# Patient Record
Sex: Female | Born: 1945 | Race: Black or African American | Hispanic: No | Marital: Single | Smoking: Current some day smoker
Health system: Southern US, Community
[De-identification: ages and names within clinical notes are randomized; demographics above are authoritative.]

## PROBLEM LIST (undated history)

## (undated) DIAGNOSIS — I10 Essential (primary) hypertension: Secondary | ICD-10-CM

## (undated) DIAGNOSIS — E7800 Pure hypercholesterolemia, unspecified: Secondary | ICD-10-CM

## (undated) DIAGNOSIS — T7840XA Allergy, unspecified, initial encounter: Secondary | ICD-10-CM

## (undated) DIAGNOSIS — Z9289 Personal history of other medical treatment: Secondary | ICD-10-CM

## (undated) DIAGNOSIS — M1990 Unspecified osteoarthritis, unspecified site: Secondary | ICD-10-CM

## (undated) HISTORY — PX: COLONOSCOPY: SHX174

## (undated) HISTORY — PX: MOUTH SURGERY: SHX715

## (undated) HISTORY — PX: NECK SURGERY: SHX720

## (undated) HISTORY — DX: Allergy, unspecified, initial encounter: T78.40XA

## (undated) HISTORY — DX: Unspecified osteoarthritis, unspecified site: M19.90

---

## 2006-08-30 DIAGNOSIS — Z9289 Personal history of other medical treatment: Secondary | ICD-10-CM

## 2006-08-30 HISTORY — DX: Personal history of other medical treatment: Z92.89

## 2007-02-03 ENCOUNTER — Inpatient Hospital Stay (HOSPITAL_COMMUNITY): Admission: EM | Admit: 2007-02-03 | Discharge: 2007-02-04 | Payer: Self-pay | Admitting: Emergency Medicine

## 2007-02-03 ENCOUNTER — Ambulatory Visit: Payer: Self-pay | Admitting: Cardiology

## 2007-02-03 ENCOUNTER — Ambulatory Visit: Payer: Self-pay | Admitting: Cardiovascular Disease

## 2007-02-03 ENCOUNTER — Encounter: Payer: Self-pay | Admitting: Cardiovascular Disease

## 2007-02-19 ENCOUNTER — Inpatient Hospital Stay (HOSPITAL_COMMUNITY): Admission: EM | Admit: 2007-02-19 | Discharge: 2007-02-21 | Payer: Self-pay | Admitting: Emergency Medicine

## 2007-07-11 ENCOUNTER — Encounter: Admission: RE | Admit: 2007-07-11 | Discharge: 2007-07-11 | Payer: Self-pay | Admitting: Internal Medicine

## 2007-10-12 ENCOUNTER — Ambulatory Visit: Payer: Self-pay | Admitting: Thoracic Surgery

## 2007-10-27 ENCOUNTER — Ambulatory Visit: Payer: Self-pay | Admitting: Thoracic Surgery

## 2007-10-27 ENCOUNTER — Encounter: Payer: Self-pay | Admitting: Thoracic Surgery

## 2007-10-27 ENCOUNTER — Inpatient Hospital Stay (HOSPITAL_COMMUNITY): Admission: RE | Admit: 2007-10-27 | Discharge: 2007-10-29 | Payer: Self-pay | Admitting: Thoracic Surgery

## 2007-11-01 ENCOUNTER — Ambulatory Visit: Payer: Self-pay | Admitting: Thoracic Surgery

## 2007-11-01 ENCOUNTER — Encounter: Admission: RE | Admit: 2007-11-01 | Discharge: 2007-11-01 | Payer: Self-pay | Admitting: Thoracic Surgery

## 2009-01-18 ENCOUNTER — Emergency Department (HOSPITAL_COMMUNITY): Admission: EM | Admit: 2009-01-18 | Discharge: 2009-01-18 | Payer: Self-pay | Admitting: Family Medicine

## 2009-04-07 ENCOUNTER — Encounter: Admission: RE | Admit: 2009-04-07 | Discharge: 2009-04-07 | Payer: Self-pay | Admitting: Internal Medicine

## 2009-04-15 ENCOUNTER — Encounter: Admission: RE | Admit: 2009-04-15 | Discharge: 2009-04-15 | Payer: Self-pay | Admitting: Internal Medicine

## 2009-07-31 ENCOUNTER — Encounter: Payer: Self-pay | Admitting: Internal Medicine

## 2009-08-07 ENCOUNTER — Encounter (INDEPENDENT_AMBULATORY_CARE_PROVIDER_SITE_OTHER): Payer: Self-pay | Admitting: *Deleted

## 2009-09-08 ENCOUNTER — Ambulatory Visit: Payer: Self-pay | Admitting: Internal Medicine

## 2009-09-08 ENCOUNTER — Encounter (INDEPENDENT_AMBULATORY_CARE_PROVIDER_SITE_OTHER): Payer: Self-pay | Admitting: *Deleted

## 2009-09-19 ENCOUNTER — Ambulatory Visit: Payer: Self-pay | Admitting: Internal Medicine

## 2009-09-24 ENCOUNTER — Encounter: Payer: Self-pay | Admitting: Internal Medicine

## 2010-09-20 ENCOUNTER — Encounter: Payer: Self-pay | Admitting: Internal Medicine

## 2010-09-21 ENCOUNTER — Encounter: Payer: Self-pay | Admitting: Internal Medicine

## 2010-09-29 NOTE — Procedures (Signed)
Summary: Colonoscopy  Patient: [name] Note: All result statuses are Final unless otherwise noted.  Tests: (1) Colonoscopy (COL)   COL Colonoscopy           DONE     [facility]     [address]     [place], [place]  [address]           COLONOSCOPY PROCEDURE REPORT           PATIENT:  Elizabeth, [name]  MR#:  [id number]     BIRTHDATE:  1946/01/04, 63 yrs. old  GENDER:  female           ENDOSCOPIST:  Iva Boop, MD, [facility]     Referred by:  Kari Baars, M.D.           PROCEDURE DATE:  09/19/2009     PROCEDURE:  Colonoscopy with snare polypectomy     ASA CLASS:  Class II     INDICATIONS:  Routine Risk Screening           MEDICATIONS:   Fentanyl 37.5 mcg IV, Versed 5 mg IV           DESCRIPTION OF PROCEDURE:   After the risks benefits and     alternatives of the procedure were thoroughly explained, informed     consent was obtained.  Digital rectal exam was performed and     revealed no abnormalities.   The LB CF-H180AL [id number] endoscope     was introduced through the anus and advanced to the cecum, which     was identified by both the appendix and ileocecal valve, without     limitations.  The quality of the prep was excellent, using     MoviPrep.  The instrument was then slowly withdrawn as the colon     was fully examined.     Insertion: 4:00 minutes Withdrawal: 12:00 minutes     <<PROCEDUREIMAGES>>           FINDINGS:  Two polyps were found in the rectum and sigmoid colon.     They were 5 mm in size. Polyps were snared without cautery.     Retrieval was successful. Mild diverticulosis was found in the     sigmoid colon.  This was otherwise a normal examination of the     colon.   Retroflexed views in the rectum revealed no     abnormalities.    The scope was then withdrawn from the patient     and the procedure completed.           COMPLICATIONS:  None           ENDOSCOPIC IMPRESSION:     1)  Two 5mm polyps in the rectum and sigmoid colon, removed   2) Mild diverticulosis in the sigmoid colon     3) Otherwise normal examination, excellent prep           REPEAT EXAM:  In for Colonoscopy, pending biopsy results.           Iva Boop, MD, [facility]           CC:  Kari Baars, MD     The Patient           n.     eSIGNED:   Iva Boop at 09/19/2009 02:12 PM           Toni Arthurs, [id number]  Note: An exclamation mark (!) indicates a result  that was not dispersed into the flowsheet. Document Creation Date: 09/19/2009 2:12 PM _______________________________________________________________________  (1) Order result status: Final Collection or observation date-time: 09/19/2009 14:03 Requested date-time:  Receipt date-time:  Reported date-time:  Referring Physician:   Ordering Physician: Stan Head [phone]) Specimen Source:  Source: Launa Grill Order Number: [id number] Lab site:   Appended Document: Colonoscopy     Procedures Next Due Date:    Colonoscopy: 09/2014

## 2010-09-29 NOTE — Letter (Signed)
Summary: [facility] Instructions  [facility]  [address] [place], [place] [address]   Phone: [phone]  Fax: [phone]       Dijon ARENA    March 08, 1946    MRN: [id number]        Procedure Day /Date: Friday, 09/19/09     Arrival Time: 12:30pm      Procedure Time: 1:30pm     Location of Procedure:                    _X_  [facility] (4th Floor)   PREPARATION FOR COLONOSCOPY WITH MOVIPREP   Starting 5 days prior to your procedure 09/14/09 do not eat nuts, seeds, popcorn, corn, beans, peas,  salads, or any raw vegetables.  Do not take any fiber supplements (e.g. Metamucil, Citrucel, and Benefiber).  THE DAY BEFORE YOUR PROCEDURE         DATE: 09/18/09     DAY: Thursday  1.  Drink clear liquids the entire day-NO SOLID FOOD  2.  Do not drink anything colored red or purple.  Avoid juices with pulp.  No orange juice.  3.  Drink at least 64 oz. (8 glasses) of fluid/clear liquids during the day to prevent dehydration and help the prep work efficiently.  CLEAR LIQUIDS INCLUDE: Water Jello Ice Popsicles Tea (sugar ok, no milk/cream) Powdered fruit flavored drinks Coffee (sugar ok, no milk/cream) Gatorade Juice: apple, white grape, white cranberry  Lemonade Clear bullion, consomm, broth Carbonated beverages (any kind) Strained chicken noodle soup Hard Candy                             4.  In the morning, mix first dose of MoviPrep solution:    Empty 1 Pouch A and 1 Pouch B into the disposable container    Add lukewarm drinking water to the top line of the container. Mix to dissolve    Refrigerate (mixed solution should be used within 24 hrs)  5.  Begin drinking the prep at 5:00 p.m. The MoviPrep container is divided by 4 marks.   Every 15 minutes drink the solution down to the next mark (approximately 8 oz) until the full liter is complete.   6.  Follow completed prep with 16 oz of clear liquid of your choice (Nothing red or purple).   Continue to drink clear liquids until bedtime.  7.  Before going to bed, mix second dose of MoviPrep solution:    Empty 1 Pouch A and 1 Pouch B into the disposable container    Add lukewarm drinking water to the top line of the container. Mix to dissolve    Refrigerate  THE DAY OF YOUR PROCEDURE      DATE: 09/19/09   DAY: Friday  Beginning at 8:30a.m. (5 hours before procedure):         1. Every 15 minutes, drink the solution down to the next mark (approx 8 oz) until the full liter is complete.  2. Follow completed prep with 16 oz. of clear liquid of your choice.    3. You may drink clear liquids until 11:30am (2 HOURS BEFORE PROCEDURE).   MEDICATION INSTRUCTIONS  Unless otherwise instructed, you should take regular prescription medications with a small sip of water   as early as possible the morning of your procedure.  Additional medication instructions: None         OTHER INSTRUCTIONS  You will  need a responsible adult at least 65 years of age to accompany you and drive you home.   This person must remain in the waiting room during your procedure.  Wear loose fitting clothing that is easily removed.  Leave jewelry and other valuables at home.  However, you may wish to bring a book to read or  an iPod/MP3 player to listen to music as you wait for your procedure to start.  Remove all body piercing jewelry and leave at home.  Total time from sign-in until discharge is approximately 2-3 hours.  You should go home directly after your procedure and rest.  You can resume normal activities the  day after your procedure.  The day of your procedure you should not:   Drive   Make legal decisions   Operate machinery   Drink alcohol   Return to work  You will receive specific instructions about eating, activities and medications before you leave.    The above instructions have been reviewed and explained to me by   _______________________    I fully understand  and can verbalize these instructions _____________________________ Date _________

## 2010-09-29 NOTE — Assessment & Plan Note (Signed)
Summary: SCREEN FOR COLON   History of Present Illness Visit Type: new patient Primary GI MD: Stan Head MD [facility] Primary Provider: Buren Kos, MD  Requesting Provider: Buren Kos, MD  Chief Complaint: Colorectal Cancer Screening  History of Present Illness:   Here to meet me prior to screening colonoscopy.    GI Review of Systems      Denies abdominal pain, acid reflux, belching, bloating, chest pain, dysphagia with liquids, dysphagia with solids, heartburn, loss of appetite, nausea, vomiting, vomiting blood, weight loss, and  weight gain.        Denies anal fissure, black tarry stools, change in bowel habit, constipation, diarrhea, diverticulosis, fecal incontinence, heme positive stool, hemorrhoids, irritable bowel syndrome, jaundice, light color stool, liver problems, rectal bleeding, and  rectal pain.    Current Medications (verified): 1)  Lisinopril 20 Mg Tabs (Lisinopril) .... Take 1 Tablet By Mouth Once A Day 2)  Verapamil Hcl Cr 180 Mg Cr-Tabs (Verapamil Hcl) .... Take 1 Tablet By Mouth Once A Day 3)  Zocor 40 Mg Tabs (Simvastatin) .... Take 1 Tab By Mouth At Bedtime 4)  Claritin 10 Mg Tabs (Loratadine) .... Take 1 Tablet By Mouth Once A Day As Needed 5)  Flonase 50 Mcg/act Susp (Fluticasone Propionate) .... As Needed 6)  Naproxen 500 Mg Tabs (Naproxen) .... As Needed 7)  Chantix Continuing Month Pak 1 Mg Tabs (Varenicline Tartrate) .... Take 1 Tablet By Mouth Once A Day As Directed  Allergies (verified): No Known Drug Allergies  Past History:  Past Medical History: Hyperlipidemia Hypertension Diabetes---Diet Control   Past Surgical History: c-section Resection of left superior mediastinal mass Tubal Ligation  Family History: Family History of Breast Cancer: Sister Family History of Prostate Cancer: Brother Family History of Stomach Cancer: Sister Family History of Heart Disease: Sister-MI No FH of Colon Cancer:  Social History: Reviewed history from  09/05/2009 and no changes required. Single, 5 boys Retired-[employer] Patient currently smokes.  Illicit Drug Use - no Patient gets regular exercise. Alcohol Use - yes-occ  Vital Signs:  Patient profile:   65 year old female Height:      61.5 inches Weight:      170 pounds BMI:     31.72 BSA:     1.78 Pulse rate:   64 / minute Pulse rhythm:   regular BP sitting:   128 / 72  (left arm) Cuff size:   regular  Vitals Entered By: Ok Anis CMA (September 08, 2009 10:50 AM)  Physical Exam  General:  obese.  NAD Lungs:  Clear throughout to auscultation. Heart:  Regular rate and rhythm; no murmurs, rubs,  or bruits. Abdomen:  obese, soft and nontender without masses Rectal:  deferred until time of colonoscopy.     Impression & Recommendations:  Problem # 1:  SCREENING COLORECTAL-CANCER (ICD-V76.51) Assessment New  Risks, benefits,and indications of endoscopic procedure(s) were reviewed with the patient and all questions answered. Routine risk.  Orders: Colonoscopy (Colon)  Patient Instructions: 1)  We will see you at your procedure on 09/19/09. 2)  Please pick up your medications at your pharmacy.  3)  [facility] Patient Information Guide given to patient.  4)  Colonoscopy and Flexible Sigmoidoscopy brochure given.  5)  The medication list was reviewed and reconciled.  All changed / newly prescribed medications were explained.  A complete medication list was provided to the patient / caregiver. Prescriptions: MOVIPREP 100 GM  SOLR (PEG-KCL-NACL-NASULF-NA ASC-C) As per prep instructions.  #1  x 0   Entered by:   Francee Piccolo CMA (AAMA)   Authorized by:   Iva Boop MD, [facility]   Signed by:   Francee Piccolo CMA (AAMA) on 09/08/2009   Method used:   Electronically to        [facility]  [address] [phone]* (retail)       [address]       [place], [place]  [address]       Ph: [phone] or [phone]       Fax:  [phone]   RxID:   [id number]

## 2010-09-29 NOTE — Letter (Signed)
Summary: Patient Notice- Polyp Results  [facility]  [address] [place], [place] [address]   Phone: [phone]  Fax: [phone]        September 24, 2009 MRN: [id number]    [name] [address] [place], [place]  [address]    Dear Ms. 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 This means that they were pre-cancerous or that  they had the potential to change into cancer over time.  I recommend that you have a repeat colonoscopy in 5 years to determine if you have developed any new polyps over time. If you develop any new rectal bleeding, abdominal pain or significant bowel habit changes, please contact us before then.  In addition to repeating colonoscopy, changing health habits may reduce your risk of having more colon polyps and possibly, colon cancer. You may lower your risk of future polyps and colon cancer by adopting healthy habits such as not smoking or using tobacco (if you do), being physically active, losing weight (if overweight), and eating a diet which includes fruits and vegetables and limits red meat.   Please call us if you are having persistent problems or have questions about your condition that have not been fully answered at this time.  Sincerely,  Iva Boop MD, [facility]  This letter has been electronically signed by your physician.  Appended Document: Patient Notice- Polyp Results Letter mailed 1.27.11

## 2011-01-12 NOTE — Letter (Signed)
November 01, 2007   Kari Baars, M.D.  [address]  [place], [place] [address]   Re:  DARRELLE, BARRELL                 DOB:  1946-04-27   Dear Gala Romney:   I saw Ms. 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 We did this through a left supraclavicular approach,  and it turned out to be a benign cyst. This was between blood vessels  and had a lot of hemosiderin pigment inside the cyst. We had to resect  it off the subclavian artery and left carotid artery and left internal  jugular vein as well as the left subclavian vein, but we were able to  get it out without too much difficulty.   Chest x-ray today shows no evidence of pneumothorax and just  postoperative changes. Her blood pressure was 163/95, pulse 88,  respirations 18, and sats were 95%.   Her wounds are healing well. I will see her back again in three weeks  for a followup.   Ines Bloomer, M.D.  Electronically Signed   DPB/MEDQ  D:  11/01/2007  T:  11/01/2007  Job:  [id number]

## 2011-01-12 NOTE — Discharge Summary (Signed)
[name], [name]                 ACCOUNT NO.:  [id number]   MEDICAL RECORD NO.:  [id number]          PATIENT TYPE:  EMS   LOCATION:  MAJO                         FACILITY:  [facility]   PHYSICIAN:  Bettey Mare. Lawrence, NPDATE OF BIRTH:  16-Dec-1945   DATE OF ADMISSION:  02/03/2007  DATE OF DISCHARGE:                               DISCHARGE SUMMARY   Audio too short to transcribe (less than 5 seconds)      Bettey Mare. Lyman Bishop, NP     KML/MEDQ  D:  02/03/2007  T:  02/03/2007  Job:  [id number]

## 2011-01-12 NOTE — Consult Note (Signed)
[name], [name]                 ACCOUNT NO.:  [id number]   MEDICAL RECORD NO.:  [id number]          PATIENT TYPE:  EMS   LOCATION:  MAJO                         FACILITY:  [facility]   PHYSICIAN:  Peter C. Eden Emms, MD, FACCDATE OF BIRTH:  [birth year]   DATE OF CONSULTATION:  02/03/2007  DATE OF DISCHARGE:                                 CONSULTATION   CARDIAC CONSULTATION:   PRIMARY CARE PHYSICIAN:  Dr. Clelia Croft.   PRIMARY CARDIOLOGIST:  Will be new, Dr. Charlton Haws.   REASON FOR CONSULTATION:  Chest pain.   HISTORY OF PRESENT ILLNESS:  This 65 year old African-American female  with no past medical history of coronary artery disease with complaints  of chest pain which began around 9:30 p.m. last night on February 02, 2007,  while getting ready for bed.  The pain comes and goes, describes as  sharp, nonradiating, with associated dyspnea and diaphoresis.  The  patient states that it lasted seconds, and even taking a deep breath  caused pain to return.  The patient states that happened on and off all  night.  She was nauseated and vomited 3 times.  When she awoke this  morning, she continues to have some chest discomfort and was seen by a  primary care physician, Dr. Clelia Croft.  Nitroglycerin sublingual was given  without relief, and aspirin was given without relief.  She was sent to  the emergency room.  The patient has had no appetite x3 days and has had  some nausea over the last 3 days.   REVIEW OF SYSTEMS:  Positive for chest pain, diaphoresis, shortness of  breath, nausea, and vomiting.  All other systems are reviewed and are  found to be negative.   PAST MEDICAL HISTORY:  1. Hypertension.  2. Hypercholesterolemia.  3. Chronic sinus allergies.   PAST SURGICAL HISTORY:  C-section.   SOCIAL:  The patient lives in [place] alone.  She is retired.  She is  single with 4 boys.  She is a 1- or 2- cigarette-a-day smoker for the  last 20 years.  Negative for EtOH and drug use.   FAMILY  HISTORY:  Mother died at age 35 of old age.  Father died at age  90 of old age.  She has 1 sister with history of breast cancer and 1  brother with prostate difficulties.   CURRENT MEDICATIONS:  1. Verapamil 180 once a day.  2. Claritin 10 mg daily.  3. Flonase once a day.  4. Naprosyn 500 mg b.i.d.  5. Zocor 40 mg q.h.s.   ALLERGIES:  No known drug allergies.   CURRENT LABORATORY:  Sodium 139, potassium of 3.7, chloride 110, CO2 of  24, BUN 10, creatinine 0.8, glucose 78, hemoglobin 15.0, hematocrit 44.  Point of care cardiac enzymes:  Troponin less than 0.05, CK-MB 1.16,  myoglobin 118.  EKG revealing normal sinus rhythm, ventricular rate of  80 beats per minute with nonspecific T wave abnormalities.  Chest x-ray  revealing no active disease.   PHYSICAL EXAMINATION:  VITAL SIGNS:  Blood pressure 152/77, pulse 84,  respirations 24, temperature  97.8, O2 sat 100% on 3 liters.  HEENT:  Head is normocephalic and atraumatic.  Eyes PERRLA.  Mucous  membranes in mouth pink and moist.  Tongue is midline.  NECK:  Supple.  There is no JVD, no carotid bruits are appreciated.  CARDIOVASCULAR:  Regular rate and rhythm with soft 1/6 systolic murmur  auscultated without rubs or gallops.  Pulses are 2+ and equal.  LUNGS:  Clear to auscultation without wheezes, rales, or rhonchi.  ABDOMEN:  Soft.  There was tenderness noted in the epigastric area with  positive Murphy's sign and exquisite tenderness to the epigastric area  with palpation with no rebound or guarding.  There was no hepatomegaly  noted.  EXTREMITIES:  Without clubbing, cyanosis, or edema.  NEURO:  Cranial nerves II-XII are grossly intact.   IMPRESSION:  1. Atypical chest pain.  2. Abdominal pain with positive Murphy's sign, rule out cholelithiasis      and cholecystitis.  3. Hypertension.  4. Hypercholesterolemia.   PLAN:  This is a 65 year old African-American female with no prior  cardiac history with nausea and vomiting x3  days and atypical chest pain  with positive Murphy's sign, negative point of care markers and EKG.  The patient was seen and examined by myself and Dr. Eden Emms in the  emergency room.  We will cycle cardiac enzymes, get an abdominal  ultrasound to rule out cholelithiasis or cholecystitis.  Echocardiogram  for left ventricular function secondary to hypertension.  GI cocktail  p.r.n.  The patient will be admitted through internal medicine, primary  care, and we will follow along at a distance, making further  recommendations for any cardiac issues or abnormalities noted.      Bettey Mare. Lyman Bishop, NP      Noralyn Pick. Eden Emms, MD, [facility]  Electronically Signed    KML/MEDQ  D:  02/03/2007  T:  02/03/2007  Job:  [id number]   cc:   Clelia Croft, MD

## 2011-01-12 NOTE — H&P (Signed)
NAMESHOLONDA, JOBST                 ACCOUNT NO.:  [id number]   MEDICAL RECORD NO.:  [id number]          PATIENT TYPE:  INP   LOCATION:  3704                         FACILITY:  [facility]   PHYSICIAN:  Tera Mater. Evlyn Kanner, M.D. DATE OF BIRTH:  [birth year]   DATE OF ADMISSION:  02/19/2007  DATE OF DISCHARGE:                              HISTORY & PHYSICAL   HISTORY OF PRESENT ILLNESS:  Ms. [name] is a 65 year old black female  who was actually recently hospitalized who presents with tongue  swelling.  This started sometime in the last 10-12 hours.  She noted  that her throat was itching anteriorly and her tongue felt thick.  She  presented for evaluation and was diagnosed with angioedema.  She  recently had been started on lisinopril on February 04, 2007 as a new  prescription.  The patient has still been able to swallow and handle her  secretions.  Her tongue, however, is significantly swollen and has the  potential for air way compromise.  At the present time most of this is  within the mucous membranes of the mouth.  Her face itself is minimally  swollen over the chin and cheeks.  Her throat itself is itchy but not  swollen in a circumferential way.  Her breathing has not been  compromised.  She feels like she is getting her air perfectly well.  She  has not exhibited any evidence of stridor.  She denies any pain or other  problems.  The patient has already received Solu-Medrol, Benadryl and  famotidine and is actually doing better.  Due to the significance of  this swelling, however, the patient needs to be admitted at least  overnight to let this process resolve.   PAST MEDICAL HISTORY:  Includes recent hospitalization on February 03, 2007  for atypical chest pain.  It was felt to be musculoskeletal.  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  She has a history of elevated CK, elevated bilirubin,  hypertension, hyperlipidemia, impaired fasting  glucose, prior C-section,  prior neck laceration 10 years ago.   SOCIAL HISTORY:  Reveals she is single.  She lives alone.  She has 5  sons.  She is retired.  She is a smoker of 1 pack per week   FAMILY HISTORY:  Positive for alcoholism, hypertension in the father.  Mother had hypertension.  Sister had stomach cancer.  Sister had MI at  age 32.  Sister had breast cancer.  Brother had prostate cancer.   MEDICATIONS:  1. Aspirin 81 mg.  2. Naproxen p.r.n..  3. Flexeril p.r.n..  4. Lisinopril 20 mg.  5. Verapamil 180 mg.  6. Claritin p.r.n.  7. Flonase p.r.n.  8. Colace.  9. MiraLax p.r.n.  10.She had been on Zocor but this was stopped due to elevated CK.   REVIEW OF SYSTEMS:  As noted above.   PHYSICAL EXAMINATION:  VITAL SIGNS:  Blood pressure 144/84, pulse 72,  respirations 18, temperature 98.1, O2 sat is 98%.  GENERAL:  We have a  healthy appearing black female sitting up in no  distress.  She is having no difficulty breathing. No stridor noted.  HEENT:  Eyes clear anicteric.  Face is symmetric.  No periorbital edema  is present.  Extraocular movements intact.  Nasopharynx is clear without  swelling.  Oral mucous membranes are quite significantly abnormal.  There is probably 4 mm of sublingual swelling.  The tongue itself is  probably twice its normal size.  She can still protrude it out of her  mouth but it is limited in its motion.  There is still a clear airway.  No whistling or wheezing or stridor is noted.  NECK:  Reveals minimal scratching abrasions on the anterior part.  The  neck itself is not significantly swollen.  LUNGS:  Clear without wheezes, rales or rhonchi.  No accessory muscles  are in use  HEART:  Regular and I cannot appreciate any murmur.  ABDOMEN:  Soft, nontender without masses.  EXTREMITIES:  Reveal pulses to be intact.  NEURO:  The patient is awake, alert.  Her mentation is perfectly clear.  No resting tremors present.  SKIN:  Reveals mild  acanthosis.   LABORATORY DATA:  There really is not any at the moment.   SUMMARY:  In summary, we have a 65 year old black female recently  started on an ACE inhibitor now presenting with significant angioedema.  She will be admitted for observation and treatment with Solu-Medrol, IV  famotidine and IV Benadryl.  I do not think a consult with ENT or  surgery is necessary at the present time as her airway compromise does  not warrant this.  Certainly, if anything progressed, we could do that.  We are going to watch her on telemetry just based on the significant  nature of this problem.  Hopefully, she will be a 1-2 day stay depending  on how her airway does.           ______________________________  Tera Mater Evlyn Kanner, M.D.     SAS/MEDQ  D:  02/19/2007  T:  02/19/2007  Job:  [id number]

## 2011-01-12 NOTE — Letter (Signed)
October 12, 2007   Kari Baars, M.D.  [address]  [place], [place] [address]   Re:  FARHA, DANO                 DOB:  04/07/1946   Dear Gala Romney:   I saw Ms. [name] back in the office today.  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  This was thought  to be a possibility of a schwanoma, periganglion, perithyroid adenoma,  ectopic thyroid or a duplication cyst.  She had a repeat done on  July 11, 2007, which showed no major change.  She had her original  CT scan done for a pulmonary emboli, which was negative.  She has some  occasional pain there.  No fevers, chills or excessive sputum.  She has  had no weight loss.   PAST MEDICAL HISTORY:  Significant for hypertension for which she takes  Verapamil 180 mg a day.  She also takes cyclobenzaprine 10 mg a day  p.r.n.   ALLERGIES:  Aspirin causes hives.   FAMILY HISTORY:  Noncontributory.   SOCIAL HISTORY:  She is single.  She smokes two to three cigarettes per  day.  Does not drink alcohol on a regular basis.   REVIEW OF SYSTEMS:  She is 176 pounds.  She is 5 feet 4 inches.  Cardiac:  No angina or atrial fibrillation.  Pulmonary:  No hemoptysis, fever, chills, or excessive sputum.  GI:  No nausea, vomiting, constipation, diarrhea.  GU:  No kidney disease, dysuria or frequent urination.  Vascular:  No claudication, DVT, TIAs.  Neurological:  No dizziness, headaches, blackouts or seizures.  Musculoskeletal:  No joint pain or rash.  No psychiatric illness.  No change in her eyesight or hearing.  No problems with bleeding or clotting disorders.   PHYSICAL EXAMINATION:  She is a well developed African American female  in no acute distress.  Head, eyes, ears, nose and throat:  Unremarkable.  Neck:  Supple without thyromegaly.  There is no supraclavicular axial  adenopathy.  Chest:  Clear to auscultation and  percussion.  Heart:  Regular sinus rhythm.  No murmurs.  Abdomen:  Soft.  No  hepatosplenomegaly.  Pulses are 2+.  There is no clubbing or edema.  Neurological:  She is oriented x3.  Sensory and motor are intact.   Pulmonary function tests showed an FVC of 1.64 or 70% of predicted with  an FEV1 of 1.22 or 64% of predicted.   We made a distinct effort to try to palpate this mass at the base of the  left neck, but could not, although the patient did state that she  thought she could feel it sometimes.  I have discussed the surgery with  her and I recommend excision.  We can do this hopefully with a left  supraclavicular approach.  If not, we will have to extend this over to  the midline and do a partial or a full median sternotomy, depending on  how this mass is involved with the subclavian artery and vein, as well  as the carotid and internal jugular.  We plan to do the surgery on the  27 of February, and we will let you know our findings.   I appreciate the opportunity of seeing Ms. [name].   Ines Bloomer, M.D.  Electronically Signed  DPB/MEDQ  D:  10/12/2007  T:  10/13/2007  Job:  [id number]

## 2011-01-12 NOTE — Op Note (Signed)
NAMEXOIE, KREUSER                 ACCOUNT NO.:  [id number]   MEDICAL RECORD NO.:  [id number]          PATIENT TYPE:  INP   LOCATION:  2899                         FACILITY:  [facility]   PHYSICIAN:  Ines Bloomer, M.D. DATE OF BIRTH:  1945/12/21   DATE OF PROCEDURE:  10/27/2007  DATE OF DISCHARGE:                               OPERATIVE REPORT   PREOPERATIVE DIAGNOSIS:  Left superior mediastinal mass.   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   OPERATION PERFORMED:  Resection of left superior mediastinal mass via  left supraclavicular approach.   After general anesthesia, the chest was prepped and draped in the usual  sterile manner.  We were going to try to excise this mass which was at  the midclavicular head of the left clavicle posterior to the carotid  artery and jugular and superior to the subclavian vein and artery. After  general anesthesia, the head was turned to the right and the neck was  extended. We prepped so we could do a median sternotomy if we needed to  to excise the mass.  An incision was made over the left  sternocleidomastoid just above the clavicle and dissection was carried  down dividing the clavicular head of the sternocleidomastoid. We then  identified the jugular vein and carotid artery and vagus nerve and  dissected them up and looped them with a vascular tape to retract them  medially and laterally.  After dissecting superiorly, you could see a  cystic structure inferiorly. This structure on CT scan was 3.9 x 3 x 2.  Dissection was started superiorly dissecting this structure up off the  surrounding tissues and then we dissected down inferiorly dissecting  medially and laterally and inferiorly, the dividing all attachments with  sharp dissection. We avoided electrocautery because of the closeness to  the vagus nerve.  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  Several small bleeding vessels were clipped.  Then, the wound was closed  with interrupted 2-0 Vicryl in the muscle layer, 3-0 Vicryl in the  subcutaneous tissue, and Dermabond for the skin.  The patient was  returned to the recovery room in stable condition.      Ines Bloomer, M.D.  Electronically Signed     DPB/MEDQ  D:  10/27/2007  T:  10/28/2007  Job:  [id number]   cc:   Kari Baars, M.D.
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
## 2011-01-12 NOTE — Discharge Summary (Signed)
NAMEJERMEKA, [name]                 ACCOUNT NO.:  [id number]   MEDICAL RECORD NO.:  [id number]          PATIENT TYPE:  INP   LOCATION:  2010                         FACILITY:  [facility]   PHYSICIAN:  Ines Bloomer, M.D. DATE OF BIRTH:  Mar 24, 1946   DATE OF ADMISSION:  10/27/2007  DATE OF DISCHARGE:  10/29/2007                               DISCHARGE SUMMARY   HISTORY OF PRESENT ILLNESS:  The patient is a 65 year old black female  who has been followed for a finding of a left superior mediastinal mass  since 2006.  CT scans have been evaluated and most recently in November  of 2008 showed no significant change in a lesion that was felt to be a  possible Schwannoma, paraganglioma, parathyroid adenoma, ectopic  thyroid, or duplication cyst.  The patient describes occasional pain in  the left side of her chest.  She has no constitutional symptoms.  She  was referred to Dr. Edwyna Shell for resection.  She was admitted this  hospitalization for the procedure.   PAST MEDICAL HISTORY:  Hypertension.   ALLERGIES:  ASPIRIN causes urticaria.   MEDICATIONS:  1. Cardizem 360 mg daily.  2. Cyclobenzaprine 10 mg p.r.n. for pain.   FAMILY HISTORY:   SOCIAL HISTORY:   REVIEW OF SYSTEMS:   PHYSICAL EXAMINATION:  Please see the history and physical done at the  time of admission.   HOSPITAL COURSE:  The patient was admitted electively on October 27, 2007, and taken to the operating room at which time she underwent the  resection of a left superior mediastinal mass via a left supraclavicular  approach.  The procedure was performed by Dr. Edwyna Shell, tolerated well,  and she was taken to the postanesthesia care unit in stable condition.   Postoperative course, the patient has overall been quite stable.  She  has maintained stable hemodynamics.  Her incision is healing without  evidence of infection or bleeding.  She did have some mild sore throat  symptoms early on, but these have resolved.  She is  able to tolerate  eating.  Oxygen has been weaned and she maintains adequate saturations  on room air.  Her laboratory values are stable.  Most recent hemoglobin  and hematocrit dated October 28, 2007, are 12.3 and 38.1, respectively.  Electrolytes BUN and creatinine are within normal limits with the  exception of hypokalemia which has been supplemented.  Overall she is  felt to be stable for discharge on October 29, 2007.   CONDITION ON DISCHARGE:  Stable and improving.   PATHOLOGY:  Currently pending.   DISCHARGE DIAGNOSES:  1. Left superior mediastinal mass, status post resection.  2. Hypertension.   FOLLOWUP:  The patient will see Dr. Edwyna Shell next week in the office.  This appointment will be called to the patient.   DISCHARGE MEDICATIONS:  1. Claritin p.r.n.  2. Verapamil SA 360 mg daily.  3. Cyclobenzaprine 10 mg q.8 hours p.r.n.  4. Tylox one to two q.6 hours p.r.n.      Rowe Clack, P.A.-C.      Ines Bloomer, M.D.  Electronically Signed    WEG/MEDQ  D:  10/29/2007  T:  10/30/2007  Job:  [id number]   cc:   Kari Baars, M.D.
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
## 2011-01-12 NOTE — H&P (Signed)
NAMECOLUMBIA, [name]                 ACCOUNT NO.:  [id number]   MEDICAL RECORD NO.:  [id number]          PATIENT TYPE:  INP   LOCATION:  NA                           FACILITY:  [facility]   PHYSICIAN:  Ines Bloomer, M.D. DATE OF BIRTH:  1945-10-19   DATE OF ADMISSION:  DATE OF DISCHARGE:                              HISTORY & PHYSICAL   HOSPITAL COURSE:  Lung mass.   HISTORY OF PRESENT ILLNESS:  This 65 year old African-American female  had a CT scan done in June of 2004 and was found to have a 2.6 x 2.9 x  3.3 left superior mediastinal mass.  There was a possibility this was a  schwannoma, paraganglioma, parathyroid adenoma, ectopic thyroid or  duplication cyst.  She had a repeat on July 11, 2007, which showed  no major change.  The original CT scan was done for pulmonary embolus,  which was negative.  She has occasional pain in her left chest.  She has  had no fevers, chills, excessive sputum, weight loss or hemoptysis.   PAST MEDICAL HISTORY:  Significant for hypertension, and she takes her  medications, including verapamil 180 mg daily, and she takes  cyclobenzaprine 10 mg daily p.r.n.   ALLERGIES:  ASPIRIN CAUSES HIVES.   FAMILY HISTORY:  Noncontributory.   SOCIAL HISTORY:  She is single.  Smokes 2 to 3 cigarettes a day.  Does  not drink alcohol on a regular basis.   REVIEW OF SYSTEMS:  She is 176 pounds.  She is 5'4.  CARDIAC:  No  angina or atrial fibrillation.  PULMONARY:  No hemoptysis, fever,  chills, excessive sputum.  GASTROINTESTINAL:  No nausea, vomiting,  constipation, diarrhea or GERD.  GENITOURINARY:  No kidney disease,  dysuria, frequent urination.  VASCULAR:  No claudication, DVT, TIA.  NEUROLOGIC:  No dizziness, headaches, blackouts or seizures.  MUSCULOSKELETAL:  No joint pain.  PSYCHIATRIC:  No nervousness or  depression.  EYES/ENT:  No change in her eyesight or hearing.  HEMATOLOGIC:  No problems with bleeding, clotting disorders or anemia.   PHYSICAL  EXAMINATION:  GENERAL:  She is a well-developed Caucasian  female.  VITAL SIGNS:  Her pulmonary function tests showed an FVC of 1.64, or 70%  of predicted, an FEV1 of 1.72 or 64% of predicted.  Her blood pressure  was 143/85, pulse 78, respirations 18, sats 95%.  HEENT:  Head is atraumatic.  Eyes:  Pupils are equal and reactive to  light and accommodation.  Extraocular movements are normal.  Ears:  Tympanic membranes are intact.  Nose:  There is no septal deviation.  Throat:  Without lesion.  Uvula is in the midline.  NECK:  Supple without thyromegaly.  There is no supraclavicular or  axillary adenopathy.  CHEST:  Clear to auscultation and percussion.  HEART:  Regular sinus rhythm.  No murmur.  ABDOMEN:  Soft.  No hepatosplenomegaly.  BREASTS:  Without masses.  EXTREMITIES:  Pulses 2+.  There is no clubbing or edema.  NEUROLOGIC:  She is oriented x 3.  Sensory and motor are intact.  Cranial nerves intact.  IMPRESSION:  1. Left superior mediastinal mass.  2. Hypertension.   PLAN:  Removal of left superior mediastinal mass with supraclavicular  incision, possible median sternotomy.      Ines Bloomer, M.D.  Electronically Signed     DPB/MEDQ  D:  10/25/2007  T:  10/25/2007  Job:  [id number] [id number]
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
## 2011-05-21 LAB — ABO/RH: ABO/RH(D): O POS

## 2011-05-21 LAB — PROTIME-INR: Prothrombin Time: 12.1

## 2011-05-21 LAB — BASIC METABOLIC PANEL
BUN: 8
CO2: 27
Chloride: 100
Chloride: 102
Creatinine, Ser: 0.72
GFR calc Af Amer: >60
GFR calc Af Amer: >60
Glucose, Bld: 93
Potassium: 3.5
Sodium: 136

## 2011-05-21 LAB — CBC
HCT: 38.1
HCT: 38.8
Hemoglobin: 12.3
Hemoglobin: 12.6
MCHC: 32.4
MCHC: 32.5
Platelets: 219
Platelets: 232
RBC: 4.6
RBC: 4.7
RDW: 16.2 — ABNORMAL HIGH
WBC: 7.1

## 2011-05-21 LAB — WOUND CULTURE: Gram Stain: NONE SEEN

## 2011-05-21 LAB — TYPE AND SCREEN

## 2011-05-21 LAB — APTT: aPTT: 50 — ABNORMAL HIGH

## 2011-06-16 LAB — BASIC METABOLIC PANEL
BUN: 12
CO2: 27
Chloride: 102
Creatinine, Ser: 0.76
GFR calc Af Amer: >60
GFR calc non Af Amer: >60
Potassium: 3.6
Sodium: 138

## 2011-06-16 LAB — CBC
Platelets: 282
RDW: 16.7 — ABNORMAL HIGH

## 2011-06-17 LAB — CK TOTAL AND CKMB (NOT AT ARMC)
CK, MB: 2.4
Total CK: 324 — ABNORMAL HIGH

## 2011-06-17 LAB — COMPREHENSIVE METABOLIC PANEL
ALT: 34
Albumin: 4.3
Alkaline Phosphatase: 91
BUN: 7
CO2: 23
Calcium: 9.1
Calcium: 9.3
Chloride: 101
Creatinine, Ser: 0.77
GFR calc Af Amer: >60
GFR calc non Af Amer: >60
Glucose, Bld: 79
Potassium: 3.9
Sodium: 140
Total Bilirubin: 1.9 — ABNORMAL HIGH
Total Protein: 7.7

## 2011-06-17 LAB — CBC
HCT: 37.4
MCHC: 32.2
MCHC: 32.6
MCV: 80.3
Platelets: 212
RBC: 4.66
RDW: 16.6 — ABNORMAL HIGH
WBC: 4.8

## 2011-06-17 LAB — POCT CARDIAC MARKERS
Operator id: 279831
Troponin i, poc: <0.05

## 2011-06-17 LAB — CARDIAC PANEL(CRET KIN+CKTOT+MB+TROPI)
CK, MB: 2.3
Relative Index: 0.7
Relative Index: 0.7
Total CK: 336 — ABNORMAL HIGH
Total CK: 358 — ABNORMAL HIGH
Total CK: 371 — ABNORMAL HIGH
Troponin I: 0.01
Troponin I: 0.01

## 2011-06-17 LAB — I-STAT 8, (EC8 V) (CONVERTED LAB)
Acid-base deficit: 5 — ABNORMAL HIGH
Bicarbonate: 17.1 — ABNORMAL LOW
Glucose, Bld: 78
HCT: 44
Hemoglobin: 15
Operator id: 279831
Potassium: 3.7
Sodium: 139
TCO2: 18

## 2011-06-17 LAB — POCT I-STAT CREATININE
Creatinine, Ser: 0.8
Operator id: 279831

## 2011-06-17 LAB — LIPASE, BLOOD: Lipase: 31

## 2011-06-17 LAB — LIPID PANEL
Cholesterol: 189
HDL: 49
Total CHOL/HDL Ratio: 3.9
Triglycerides: 176 — ABNORMAL HIGH

## 2011-06-17 LAB — AMYLASE: Amylase: 83

## 2011-06-17 LAB — H. PYLORI ANTIBODY, IGG: H Pylori IgG: 0.8

## 2011-09-28 ENCOUNTER — Emergency Department (HOSPITAL_COMMUNITY)
Admission: EM | Admit: 2011-09-28 | Discharge: 2011-09-28 | Disposition: A | Discharge status: Home / Self Care | Payer: No Typology Code available for payment source | Attending: Emergency Medicine | Admitting: Emergency Medicine

## 2011-09-28 ENCOUNTER — Other Ambulatory Visit: Payer: Self-pay

## 2011-09-28 ENCOUNTER — Emergency Department (HOSPITAL_COMMUNITY): Payer: No Typology Code available for payment source

## 2011-09-28 ENCOUNTER — Encounter (HOSPITAL_COMMUNITY): Payer: Self-pay

## 2011-09-28 DIAGNOSIS — S8000XA Contusion of unspecified knee, initial encounter: Secondary | ICD-10-CM | POA: Insufficient documentation

## 2011-09-28 DIAGNOSIS — S0990XA Unspecified injury of head, initial encounter: Secondary | ICD-10-CM | POA: Insufficient documentation

## 2011-09-28 DIAGNOSIS — I10 Essential (primary) hypertension: Secondary | ICD-10-CM | POA: Insufficient documentation

## 2011-09-28 DIAGNOSIS — M25469 Effusion, unspecified knee: Secondary | ICD-10-CM | POA: Insufficient documentation

## 2011-09-28 DIAGNOSIS — I2510 Atherosclerotic heart disease of native coronary artery without angina pectoris: Secondary | ICD-10-CM | POA: Insufficient documentation

## 2011-09-28 DIAGNOSIS — S40019A Contusion of unspecified shoulder, initial encounter: Secondary | ICD-10-CM

## 2011-09-28 DIAGNOSIS — R51 Headache: Secondary | ICD-10-CM | POA: Insufficient documentation

## 2011-09-28 DIAGNOSIS — S51019A Laceration without foreign body of unspecified elbow, initial encounter: Secondary | ICD-10-CM

## 2011-09-28 DIAGNOSIS — E7800 Pure hypercholesterolemia, unspecified: Secondary | ICD-10-CM | POA: Insufficient documentation

## 2011-09-28 DIAGNOSIS — R42 Dizziness and giddiness: Secondary | ICD-10-CM | POA: Insufficient documentation

## 2011-09-28 DIAGNOSIS — S51009A Unspecified open wound of unspecified elbow, initial encounter: Secondary | ICD-10-CM | POA: Insufficient documentation

## 2011-09-28 DIAGNOSIS — M25519 Pain in unspecified shoulder: Secondary | ICD-10-CM | POA: Insufficient documentation

## 2011-09-28 DIAGNOSIS — IMO0002 Reserved for concepts with insufficient information to code with codable children: Secondary | ICD-10-CM | POA: Insufficient documentation

## 2011-09-28 DIAGNOSIS — M542 Cervicalgia: Secondary | ICD-10-CM | POA: Insufficient documentation

## 2011-09-28 DIAGNOSIS — M25569 Pain in unspecified knee: Secondary | ICD-10-CM | POA: Insufficient documentation

## 2011-09-28 DIAGNOSIS — S139XXA Sprain of joints and ligaments of unspecified parts of neck, initial encounter: Secondary | ICD-10-CM | POA: Insufficient documentation

## 2011-09-28 DIAGNOSIS — S161XXA Strain of muscle, fascia and tendon at neck level, initial encounter: Secondary | ICD-10-CM

## 2011-09-28 HISTORY — DX: Pure hypercholesterolemia, unspecified: E78.00

## 2011-09-28 HISTORY — DX: Essential (primary) hypertension: I10

## 2011-09-28 LAB — CBC
HCT: 39.3 % (ref 36.0–46.0)
MCHC: 33.3 g/dL (ref 30.0–36.0)
MCV: 78.8 fL (ref 78.0–100.0)
RDW: 16.3 % — ABNORMAL HIGH (ref 11.5–15.5)

## 2011-09-28 LAB — DIFFERENTIAL
Basophils Absolute: 0 10*3/uL (ref 0.0–0.1)
Basophils Relative: 1 % (ref 0–1)
Eosinophils Absolute: 0.1 10*3/uL (ref 0.0–0.7)
Eosinophils Relative: 1 % (ref 0–5)
Monocytes Absolute: 0.5 10*3/uL (ref 0.1–1.0)

## 2011-09-28 LAB — POCT I-STAT, CHEM 8
Calcium, Ion: 1.15 mmol/L (ref 1.12–1.32)
HCT: 41 % (ref 36.0–46.0)
TCO2: 26 mmol/L (ref 0–100)

## 2011-09-28 LAB — POCT I-STAT TROPONIN I: Troponin i, poc: 0 ng/mL (ref 0.00–0.08)

## 2011-09-28 MED ORDER — ONDANSETRON HCL 4 MG/2ML IJ SOLN
4.0000 mg | Freq: Once | INTRAMUSCULAR | Status: AC
  Administered 2011-09-28: 4 mg via INTRAVENOUS
  Filled 2011-09-28: qty 2

## 2011-09-28 MED ORDER — MORPHINE SULFATE 4 MG/ML IJ SOLN
4.0000 mg | Freq: Once | INTRAMUSCULAR | Status: AC
  Administered 2011-09-28: 4 mg via INTRAVENOUS
  Filled 2011-09-28: qty 1

## 2011-09-28 NOTE — Progress Notes (Signed)
Chaplain Note:  Chaplain responded immediately to page received at 13:31.  Chaplain provided spiritual comfort and support for pt and staff.  Pt complained of severe head pain.  Chaplain informed nurse for follow up.  Pt and staff appreciated chaplain support.  No follow up necessary.  Verdie Shire, chaplain resident [phone]

## 2011-09-28 NOTE — ED Notes (Signed)
Gave old and new ECG to Dr. Lynelle Doctor after I performed. 2:06pm JG.
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
## 2011-09-28 NOTE — ED Notes (Signed)
Pt denies any pain at this time. Tech assisting dressing for discharge.
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
## 2011-09-28 NOTE — ED Notes (Signed)
MD at bedside to suture.
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
## 2011-11-09 DIAGNOSIS — I10 Essential (primary) hypertension: Secondary | ICD-10-CM | POA: Diagnosis not present

## 2011-11-09 DIAGNOSIS — J0100 Acute maxillary sinusitis, unspecified: Secondary | ICD-10-CM | POA: Diagnosis not present

## 2011-11-09 DIAGNOSIS — R05 Cough: Secondary | ICD-10-CM | POA: Diagnosis not present

## 2011-11-09 DIAGNOSIS — J309 Allergic rhinitis, unspecified: Secondary | ICD-10-CM | POA: Diagnosis not present

## 2012-04-10 DIAGNOSIS — R05 Cough: Secondary | ICD-10-CM | POA: Diagnosis not present

## 2012-04-10 DIAGNOSIS — J069 Acute upper respiratory infection, unspecified: Secondary | ICD-10-CM | POA: Diagnosis not present

## 2012-04-10 DIAGNOSIS — I10 Essential (primary) hypertension: Secondary | ICD-10-CM | POA: Diagnosis not present

## 2012-04-10 DIAGNOSIS — M159 Polyosteoarthritis, unspecified: Secondary | ICD-10-CM | POA: Diagnosis not present

## 2012-05-03 DIAGNOSIS — R05 Cough: Secondary | ICD-10-CM | POA: Diagnosis not present

## 2012-05-03 DIAGNOSIS — R7301 Impaired fasting glucose: Secondary | ICD-10-CM | POA: Diagnosis not present

## 2012-05-03 DIAGNOSIS — J069 Acute upper respiratory infection, unspecified: Secondary | ICD-10-CM | POA: Diagnosis not present

## 2012-05-03 DIAGNOSIS — L659 Nonscarring hair loss, unspecified: Secondary | ICD-10-CM | POA: Diagnosis not present

## 2012-05-03 DIAGNOSIS — I10 Essential (primary) hypertension: Secondary | ICD-10-CM | POA: Diagnosis not present

## 2012-10-25 DIAGNOSIS — IMO0001 Reserved for inherently not codable concepts without codable children: Secondary | ICD-10-CM | POA: Diagnosis not present

## 2012-10-25 DIAGNOSIS — I10 Essential (primary) hypertension: Secondary | ICD-10-CM | POA: Diagnosis not present

## 2012-10-25 DIAGNOSIS — E785 Hyperlipidemia, unspecified: Secondary | ICD-10-CM | POA: Diagnosis not present

## 2012-10-25 DIAGNOSIS — R7301 Impaired fasting glucose: Secondary | ICD-10-CM | POA: Diagnosis not present

## 2012-11-30 DIAGNOSIS — E785 Hyperlipidemia, unspecified: Secondary | ICD-10-CM | POA: Diagnosis not present

## 2012-11-30 DIAGNOSIS — E669 Obesity, unspecified: Secondary | ICD-10-CM | POA: Diagnosis not present

## 2012-11-30 DIAGNOSIS — IMO0001 Reserved for inherently not codable concepts without codable children: Secondary | ICD-10-CM | POA: Diagnosis not present

## 2012-11-30 DIAGNOSIS — I10 Essential (primary) hypertension: Secondary | ICD-10-CM | POA: Diagnosis not present

## 2012-11-30 DIAGNOSIS — L659 Nonscarring hair loss, unspecified: Secondary | ICD-10-CM | POA: Diagnosis not present

## 2013-01-02 DIAGNOSIS — I10 Essential (primary) hypertension: Secondary | ICD-10-CM | POA: Diagnosis not present

## 2013-01-02 DIAGNOSIS — Z6833 Body mass index (BMI) 33.0-33.9, adult: Secondary | ICD-10-CM | POA: Diagnosis not present

## 2013-01-12 DIAGNOSIS — H2510 Age-related nuclear cataract, unspecified eye: Secondary | ICD-10-CM | POA: Diagnosis not present

## 2013-02-02 DIAGNOSIS — J069 Acute upper respiratory infection, unspecified: Secondary | ICD-10-CM | POA: Diagnosis not present

## 2013-02-02 DIAGNOSIS — I10 Essential (primary) hypertension: Secondary | ICD-10-CM | POA: Diagnosis not present

## 2013-02-02 DIAGNOSIS — Z6833 Body mass index (BMI) 33.0-33.9, adult: Secondary | ICD-10-CM | POA: Diagnosis not present

## 2013-04-03 DIAGNOSIS — E785 Hyperlipidemia, unspecified: Secondary | ICD-10-CM | POA: Diagnosis not present

## 2013-04-03 DIAGNOSIS — R7301 Impaired fasting glucose: Secondary | ICD-10-CM | POA: Diagnosis not present

## 2013-04-03 DIAGNOSIS — I10 Essential (primary) hypertension: Secondary | ICD-10-CM | POA: Diagnosis not present

## 2013-04-03 DIAGNOSIS — R82998 Other abnormal findings in urine: Secondary | ICD-10-CM | POA: Diagnosis not present

## 2013-04-10 ENCOUNTER — Other Ambulatory Visit: Payer: Self-pay | Admitting: Internal Medicine

## 2013-04-10 DIAGNOSIS — E669 Obesity, unspecified: Secondary | ICD-10-CM | POA: Diagnosis not present

## 2013-04-10 DIAGNOSIS — I10 Essential (primary) hypertension: Secondary | ICD-10-CM | POA: Diagnosis not present

## 2013-04-10 DIAGNOSIS — Z0000 Encounter for general adult medical examination without abnormal findings: Secondary | ICD-10-CM | POA: Diagnosis not present

## 2013-04-10 DIAGNOSIS — R7301 Impaired fasting glucose: Secondary | ICD-10-CM | POA: Diagnosis not present

## 2013-04-10 DIAGNOSIS — J069 Acute upper respiratory infection, unspecified: Secondary | ICD-10-CM | POA: Diagnosis not present

## 2013-04-10 DIAGNOSIS — D126 Benign neoplasm of colon, unspecified: Secondary | ICD-10-CM | POA: Diagnosis not present

## 2013-04-10 DIAGNOSIS — N63 Unspecified lump in unspecified breast: Secondary | ICD-10-CM

## 2013-04-10 DIAGNOSIS — F17200 Nicotine dependence, unspecified, uncomplicated: Secondary | ICD-10-CM | POA: Diagnosis not present

## 2013-04-10 DIAGNOSIS — E785 Hyperlipidemia, unspecified: Secondary | ICD-10-CM | POA: Diagnosis not present

## 2013-04-17 DIAGNOSIS — Z124 Encounter for screening for malignant neoplasm of cervix: Secondary | ICD-10-CM | POA: Diagnosis not present

## 2013-04-17 DIAGNOSIS — Z6833 Body mass index (BMI) 33.0-33.9, adult: Secondary | ICD-10-CM | POA: Diagnosis not present

## 2013-05-02 ENCOUNTER — Other Ambulatory Visit: Payer: Medicare Other

## 2013-05-21 ENCOUNTER — Ambulatory Visit
Admission: RE | Admit: 2013-05-21 | Discharge: 2013-05-21 | Disposition: A | Discharge status: Home / Self Care | Payer: Medicare Other | Source: Ambulatory Visit | Attending: Internal Medicine | Admitting: Internal Medicine

## 2013-05-21 ENCOUNTER — Other Ambulatory Visit: Payer: Self-pay | Admitting: Internal Medicine

## 2013-05-21 DIAGNOSIS — N63 Unspecified lump in unspecified breast: Secondary | ICD-10-CM

## 2013-05-21 DIAGNOSIS — Z1231 Encounter for screening mammogram for malignant neoplasm of breast: Secondary | ICD-10-CM | POA: Diagnosis not present

## 2013-09-14 DIAGNOSIS — J209 Acute bronchitis, unspecified: Secondary | ICD-10-CM | POA: Diagnosis not present

## 2013-10-11 DIAGNOSIS — Z23 Encounter for immunization: Secondary | ICD-10-CM | POA: Diagnosis not present

## 2013-10-11 DIAGNOSIS — E785 Hyperlipidemia, unspecified: Secondary | ICD-10-CM | POA: Diagnosis not present

## 2013-10-11 DIAGNOSIS — E669 Obesity, unspecified: Secondary | ICD-10-CM | POA: Diagnosis not present

## 2013-10-11 DIAGNOSIS — J209 Acute bronchitis, unspecified: Secondary | ICD-10-CM | POA: Diagnosis not present

## 2013-10-11 DIAGNOSIS — R7301 Impaired fasting glucose: Secondary | ICD-10-CM | POA: Diagnosis not present

## 2013-10-11 DIAGNOSIS — I10 Essential (primary) hypertension: Secondary | ICD-10-CM | POA: Diagnosis not present

## 2013-10-11 DIAGNOSIS — Z6833 Body mass index (BMI) 33.0-33.9, adult: Secondary | ICD-10-CM | POA: Diagnosis not present

## 2013-10-11 DIAGNOSIS — Z1331 Encounter for screening for depression: Secondary | ICD-10-CM | POA: Diagnosis not present

## 2013-11-07 DIAGNOSIS — I10 Essential (primary) hypertension: Secondary | ICD-10-CM | POA: Diagnosis not present

## 2013-11-07 DIAGNOSIS — R7301 Impaired fasting glucose: Secondary | ICD-10-CM | POA: Diagnosis not present

## 2013-11-07 DIAGNOSIS — Z6834 Body mass index (BMI) 34.0-34.9, adult: Secondary | ICD-10-CM | POA: Diagnosis not present
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
## 2013-12-10 DIAGNOSIS — Z6834 Body mass index (BMI) 34.0-34.9, adult: Secondary | ICD-10-CM | POA: Diagnosis not present

## 2013-12-10 DIAGNOSIS — I10 Essential (primary) hypertension: Secondary | ICD-10-CM | POA: Diagnosis not present

## 2014-01-18 DIAGNOSIS — H2510 Age-related nuclear cataract, unspecified eye: Secondary | ICD-10-CM | POA: Diagnosis not present

## 2014-01-18 DIAGNOSIS — H04129 Dry eye syndrome of unspecified lacrimal gland: Secondary | ICD-10-CM | POA: Diagnosis not present

## 2014-04-22 DIAGNOSIS — Z79899 Other long term (current) drug therapy: Secondary | ICD-10-CM | POA: Diagnosis not present

## 2014-04-22 DIAGNOSIS — R7301 Impaired fasting glucose: Secondary | ICD-10-CM | POA: Diagnosis not present

## 2014-04-22 DIAGNOSIS — E785 Hyperlipidemia, unspecified: Secondary | ICD-10-CM | POA: Diagnosis not present

## 2014-04-22 DIAGNOSIS — R82998 Other abnormal findings in urine: Secondary | ICD-10-CM | POA: Diagnosis not present

## 2014-04-22 DIAGNOSIS — I10 Essential (primary) hypertension: Secondary | ICD-10-CM | POA: Diagnosis not present

## 2014-04-29 DIAGNOSIS — IMO0001 Reserved for inherently not codable concepts without codable children: Secondary | ICD-10-CM | POA: Diagnosis not present

## 2014-04-29 DIAGNOSIS — R7301 Impaired fasting glucose: Secondary | ICD-10-CM | POA: Diagnosis not present

## 2014-04-29 DIAGNOSIS — R82998 Other abnormal findings in urine: Secondary | ICD-10-CM | POA: Diagnosis not present

## 2014-04-29 DIAGNOSIS — E785 Hyperlipidemia, unspecified: Secondary | ICD-10-CM | POA: Diagnosis not present

## 2014-04-29 DIAGNOSIS — J42 Unspecified chronic bronchitis: Secondary | ICD-10-CM | POA: Diagnosis not present

## 2014-04-29 DIAGNOSIS — Z0000 Encounter for general adult medical examination without abnormal findings: Secondary | ICD-10-CM | POA: Diagnosis not present

## 2014-04-29 DIAGNOSIS — I10 Essential (primary) hypertension: Secondary | ICD-10-CM | POA: Diagnosis not present

## 2014-04-29 DIAGNOSIS — E669 Obesity, unspecified: Secondary | ICD-10-CM | POA: Diagnosis not present

## 2014-05-01 DIAGNOSIS — Z1212 Encounter for screening for malignant neoplasm of rectum: Secondary | ICD-10-CM | POA: Diagnosis not present

## 2014-09-17 ENCOUNTER — Encounter: Payer: Self-pay | Admitting: Internal Medicine

## 2014-11-12 DIAGNOSIS — E669 Obesity, unspecified: Secondary | ICD-10-CM | POA: Diagnosis not present

## 2014-11-12 DIAGNOSIS — E785 Hyperlipidemia, unspecified: Secondary | ICD-10-CM | POA: Diagnosis not present

## 2014-11-12 DIAGNOSIS — F17200 Nicotine dependence, unspecified, uncomplicated: Secondary | ICD-10-CM | POA: Diagnosis not present

## 2014-11-12 DIAGNOSIS — Z6832 Body mass index (BMI) 32.0-32.9, adult: Secondary | ICD-10-CM | POA: Diagnosis not present

## 2014-11-12 DIAGNOSIS — R7301 Impaired fasting glucose: Secondary | ICD-10-CM | POA: Diagnosis not present

## 2014-11-12 DIAGNOSIS — I10 Essential (primary) hypertension: Secondary | ICD-10-CM | POA: Diagnosis not present

## 2015-01-21 ENCOUNTER — Encounter: Payer: Self-pay | Admitting: Internal Medicine

## 2015-05-26 ENCOUNTER — Encounter: Payer: Self-pay | Admitting: Internal Medicine

## 2015-07-02 DIAGNOSIS — Z23 Encounter for immunization: Secondary | ICD-10-CM | POA: Diagnosis not present

## 2015-09-29 DIAGNOSIS — R5383 Other fatigue: Secondary | ICD-10-CM | POA: Diagnosis not present

## 2015-09-29 DIAGNOSIS — R112 Nausea with vomiting, unspecified: Secondary | ICD-10-CM | POA: Diagnosis not present

## 2015-09-29 DIAGNOSIS — Z6831 Body mass index (BMI) 31.0-31.9, adult: Secondary | ICD-10-CM | POA: Diagnosis not present

## 2015-09-29 DIAGNOSIS — I10 Essential (primary) hypertension: Secondary | ICD-10-CM | POA: Diagnosis not present

## 2015-09-29 DIAGNOSIS — R55 Syncope and collapse: Secondary | ICD-10-CM | POA: Diagnosis not present

## 2015-09-29 DIAGNOSIS — J309 Allergic rhinitis, unspecified: Secondary | ICD-10-CM | POA: Diagnosis not present

## 2015-09-29 DIAGNOSIS — M159 Polyosteoarthritis, unspecified: Secondary | ICD-10-CM | POA: Diagnosis not present

## 2015-09-29 DIAGNOSIS — R51 Headache: Secondary | ICD-10-CM | POA: Diagnosis not present

## 2015-11-24 ENCOUNTER — Other Ambulatory Visit: Payer: Self-pay | Admitting: Internal Medicine

## 2015-11-24 DIAGNOSIS — Z1231 Encounter for screening mammogram for malignant neoplasm of breast: Secondary | ICD-10-CM

## 2015-11-25 ENCOUNTER — Encounter: Payer: Self-pay | Admitting: Internal Medicine

## 2015-12-12 ENCOUNTER — Ambulatory Visit: Payer: Medicare Other

## 2016-01-08 ENCOUNTER — Encounter: Payer: Self-pay | Admitting: Internal Medicine

## 2016-01-21 ENCOUNTER — Encounter: Payer: Medicare Other | Admitting: Internal Medicine

## 2016-02-02 ENCOUNTER — Ambulatory Visit
Admission: RE | Admit: 2016-02-02 | Discharge: 2016-02-02 | Disposition: A | Discharge status: Home / Self Care | Payer: Medicaid Other | Source: Ambulatory Visit | Attending: Internal Medicine | Admitting: Internal Medicine

## 2016-02-02 DIAGNOSIS — Z1231 Encounter for screening mammogram for malignant neoplasm of breast: Secondary | ICD-10-CM

## 2016-02-04 ENCOUNTER — Ambulatory Visit: Payer: Medicaid Other

## 2016-02-13 ENCOUNTER — Encounter: Payer: Medicaid Other | Admitting: Internal Medicine

## 2016-02-19 ENCOUNTER — Ambulatory Visit (AMBULATORY_SURGERY_CENTER): Payer: Self-pay

## 2016-02-19 VITALS — Ht 61.5 in | Wt 168.2 lb

## 2016-02-19 DIAGNOSIS — Z860100 Personal history of colon polyps, unspecified: Secondary | ICD-10-CM

## 2016-02-19 NOTE — Progress Notes (Signed)
No allergies to eggs or soy No diet meds No home oxygen No past problems with anesthesia  No internet 

## 2016-03-04 ENCOUNTER — Encounter: Payer: Medicare Other | Admitting: Internal Medicine

## 2016-03-10 ENCOUNTER — Telehealth: Payer: Self-pay | Admitting: Internal Medicine

## 2016-03-10 NOTE — Telephone Encounter (Signed)
Returned patient's call and she had started her prep yesterday and only had 1 BM and was not doing it  correctly.  I read her instructions and she was to start her prep at 5 pm this evening and take dulcolax tablets at 3 pm today.  She states she did not have those.  I explained that she would have to purchase those OTC and another bottle of Miralax with 32 oz Gatorade. She is to start first half today and drink second half tomorrow morning at 9 am.  I also advised her to stay on clear liquids until 11 am tomorrow and nothing by mouth after that.  I told her to arrive for her procedure at 1 pm tomorrow.  She stated that she understood but I felt she was having a hard time understanding.  I told her to call back if she had any further questions concerning this.

## 2016-03-11 ENCOUNTER — Ambulatory Visit (AMBULATORY_SURGERY_CENTER): Payer: Medicare Other | Admitting: Internal Medicine

## 2016-03-11 ENCOUNTER — Encounter: Payer: Self-pay | Admitting: Internal Medicine

## 2016-03-11 VITALS — BP 140/67 | HR 77 | Temp 96.4°F | Resp 17 | Ht 61.5 in | Wt 168.0 lb

## 2016-03-11 DIAGNOSIS — Z86010 Personal history of colonic polyps: Secondary | ICD-10-CM | POA: Diagnosis not present

## 2016-03-11 MED ORDER — SODIUM CHLORIDE 0.9 % IV SOLN: 500.0000 mL | INTRAVENOUS | Status: DC

## 2016-03-11 NOTE — Progress Notes (Signed)
A/ox3 pleased with MAC, report to 

## 2016-03-11 NOTE — Op Note (Signed)
[place] Patient Name: [name] Procedure Date: 03/11/2016 1:56 PM MRN: [id number] Endoscopist: Gatha Mayer , MD Age: 70 Referring MD:  Date of Birth: 1946/03/05 Gender: Female Account #: [id number] Procedure:                Colonoscopy Indications:              High risk colon cancer surveillance: Personal                            history of colonic polyps Medicines:                Propofol per Anesthesia, Monitored Anesthesia Care Procedure:                Pre-Anesthesia Assessment:                           - Prior to the procedure, a History and Physical                            was performed, and patient medications and                            allergies were reviewed. The patient's tolerance of                            previous anesthesia was also reviewed. The risks                            and benefits of the procedure and the sedation                            options and risks were discussed with the patient.                            All questions were answered, and informed consent                            was obtained. Prior Anticoagulants: The patient has                            taken no previous anticoagulant or antiplatelet                            agents. ASA Grade Assessment: II - A patient with                            mild systemic disease. After reviewing the risks                            and benefits, the patient was deemed in                            satisfactory condition to undergo the procedure.  After obtaining informed consent, the colonoscope                            was passed under direct vision. Throughout the                            procedure, the patient's blood pressure, pulse, and                            oxygen saturations were monitored continuously. The                            Model CF-HQ190L [id number]) scope was introduced                            through the  anus and advanced to the the cecum,                            identified by appendiceal orifice and ileocecal                            valve. The ileocecal valve, appendiceal orifice,                            and rectum were photographed. The quality of the                            bowel preparation was adequate. The bowel                            preparation used was Miralax. Scope In: 2:12:29 PM Scope Out: 2:31:46 PM Scope Withdrawal Time: 0 hours 15 minutes 43 seconds  Total Procedure Duration: 0 hours 19 minutes 17 seconds  Findings:                 The perianal and digital rectal examinations were                            normal.                           The entire examined colon appeared normal on direct                            and retroflexion views. Complications:            No immediate complications. Estimated blood loss:                            None. Estimated Blood Loss:     Estimated blood loss: none. Impression:               - The entire examined colon is normal on direct and                            retroflexion views.                           -  No specimens collected.                           - Personal history of colonic polyps. 2 adenomas                            2011 Recommendation:           - Repeat colonoscopy in 5 years for surveillance.                           - Resume previous diet.                           - Continue present medications.                           - Patient has a contact number available for                            emergencies. The signs and symptoms of potential                            delayed complications were discussed with the                            patient. Return to normal activities tomorrow.                            Written discharge instructions were provided to the                            patient. Gatha Mayer, MD 03/11/2016 2:36:54 PM This report has been signed electronically.

## 2016-03-11 NOTE — Patient Instructions (Addendum)
No polyps today. Your next routine colonoscopy should be in 5 years - 2022.  I appreciate the opportunity to care for you. Gatha Mayer, MD, FACG   YOU HAD AN ENDOSCOPIC PROCEDURE TODAY AT [facility]:   Refer to the procedure report that was given to you for any specific questions about what was found during the examination.  If the procedure report does not answer your questions, please call your gastroenterologist to clarify.  If you requested that your care partner not be given the details of your procedure findings, then the procedure report has been included in a sealed envelope for you to review at your convenience later.  YOU SHOULD EXPECT: Some feelings of bloating in the abdomen. Passage of more gas than usual.  Walking can help get rid of the air that was put into your GI tract during the procedure and reduce the bloating. If you had a lower endoscopy (such as a colonoscopy or flexible sigmoidoscopy) you may notice spotting of blood in your stool or on the toilet paper. If you underwent a bowel prep for your procedure, you may not have a normal bowel movement for a few days.  Please Note:  You might notice some irritation and congestion in your nose or some drainage.  This is from the oxygen used during your procedure.  There is no need for concern and it should clear up in a day or so.  SYMPTOMS TO REPORT IMMEDIATELY:   Following lower endoscopy (colonoscopy or flexible sigmoidoscopy):  Excessive amounts of blood in the stool  Significant tenderness or worsening of abdominal pains  Swelling of the abdomen that is new, acute  Fever of 100F or higher  For urgent or emergent issues, a gastroenterologist can be reached at any hour by calling [phone].   DIET: Your first meal following the procedure should be a small meal and then it is ok to progress to your normal diet. Heavy or fried foods are harder to digest and may make you feel nauseous or  bloated.  Likewise, meals heavy in dairy and vegetables can increase bloating.  Drink plenty of fluids but you should avoid alcoholic beverages for 24 hours.  ACTIVITY:  You should plan to take it easy for the rest of today and you should NOT DRIVE or use heavy machinery until tomorrow (because of the sedation medicines used during the test).    FOLLOW UP: Our staff will call the number listed on your records the next business day following your procedure to check on you and address any questions or concerns that you may have regarding the information given to you following your procedure. If we do not reach you, we will leave a message.  However, if you are feeling well and you are not experiencing any problems, there is no need to return our call.  We will assume that you have returned to your regular daily activities without incident.  If any biopsies were taken you will be contacted by phone or by letter within the next 1-3 weeks.  Please call us at [phone] if you have not heard about the biopsies in 3 weeks.    SIGNATURES/CONFIDENTIALITY: You and/or your care partner have signed paperwork which will be entered into your electronic medical record.  These signatures attest to the fact that that the information above on your After Visit Summary has been reviewed and is understood.  Full responsibility of the confidentiality of this discharge information lies  with you and/or your care-partner.  Diverticulosis-handout given  Repeat colonoscopy in 5 years 2022 for personal hx of colon polyps.

## 2016-03-11 NOTE — Progress Notes (Signed)
Pt is a poor historian.  maw

## 2016-03-12 ENCOUNTER — Telehealth: Payer: Self-pay

## 2016-03-12 NOTE — Telephone Encounter (Signed)
  Follow up Call-  Call back number 03/11/2016  Post procedure Call Back phone  # [phone] cell  Permission to leave phone message Yes     Patient questions:  Do you have a fever, pain , or abdominal swelling? No. Pain Score  0 *  Have you tolerated food without any problems? Yes.    Have you been able to return to your normal activities? Yes.    Do you have any questions about your discharge instructions: Diet   No. Medications  No. Follow up visit  No.  Do you have questions or concerns about your Care? No.  Actions: * If pain score is 4 or above: No action needed, pain <4.

## 2016-05-10 ENCOUNTER — Emergency Department (HOSPITAL_COMMUNITY): Payer: Medicare Other

## 2016-05-10 ENCOUNTER — Emergency Department (HOSPITAL_COMMUNITY)
Admission: EM | Admit: 2016-05-10 | Discharge: 2016-05-11 | Disposition: A | Discharge status: Home / Self Care | Payer: Medicare Other | Attending: Emergency Medicine | Admitting: Emergency Medicine

## 2016-05-10 ENCOUNTER — Encounter (HOSPITAL_COMMUNITY): Payer: Self-pay

## 2016-05-10 DIAGNOSIS — E860 Dehydration: Secondary | ICD-10-CM | POA: Diagnosis not present

## 2016-05-10 DIAGNOSIS — Z79899 Other long term (current) drug therapy: Secondary | ICD-10-CM | POA: Insufficient documentation

## 2016-05-10 DIAGNOSIS — R1084 Generalized abdominal pain: Secondary | ICD-10-CM | POA: Diagnosis present

## 2016-05-10 DIAGNOSIS — F17210 Nicotine dependence, cigarettes, uncomplicated: Secondary | ICD-10-CM | POA: Insufficient documentation

## 2016-05-10 DIAGNOSIS — I10 Essential (primary) hypertension: Secondary | ICD-10-CM | POA: Insufficient documentation

## 2016-05-10 DIAGNOSIS — I2510 Atherosclerotic heart disease of native coronary artery without angina pectoris: Secondary | ICD-10-CM | POA: Insufficient documentation

## 2016-05-10 LAB — URINALYSIS, ROUTINE W REFLEX MICROSCOPIC
Glucose, UA: NEGATIVE mg/dL
KETONES UR: NEGATIVE mg/dL
Leukocytes, UA: NEGATIVE
NITRITE: NEGATIVE
PROTEIN: 30 mg/dL — AB
SPECIFIC GRAVITY, URINE: 1.036 — AB (ref 1.005–1.030)
pH: 5 (ref 5.0–8.0)

## 2016-05-10 LAB — COMPREHENSIVE METABOLIC PANEL
ALBUMIN: 4.6 g/dL (ref 3.5–5.0)
ALK PHOS: 95 U/L (ref 38–126)
ALT: 21 U/L (ref 14–54)
ANION GAP: 12 (ref 5–15)
AST: 29 U/L (ref 15–41)
BILIRUBIN TOTAL: 0.6 mg/dL (ref 0.3–1.2)
BUN: 14 mg/dL (ref 6–20)
CALCIUM: 10.1 mg/dL (ref 8.9–10.3)
CO2: 26 mmol/L (ref 22–32)
CREATININE: 0.98 mg/dL (ref 0.44–1.00)
Chloride: 97 mmol/L — ABNORMAL LOW (ref 101–111)
GFR calc Af Amer: >60 mL/min (ref >60)
GFR calc non Af Amer: 57 mL/min — ABNORMAL LOW (ref >60)
GLUCOSE: 106 mg/dL — AB (ref 65–99)
Potassium: 3.2 mmol/L — ABNORMAL LOW (ref 3.5–5.1)
Sodium: 135 mmol/L (ref 135–145)
TOTAL PROTEIN: 9 g/dL — AB (ref 6.5–8.1)

## 2016-05-10 LAB — CBC
HCT: 45.8 % (ref 36.0–46.0)
HEMOGLOBIN: 15.2 g/dL — AB (ref 12.0–15.0)
MCH: 25.8 pg — AB (ref 26.0–34.0)
MCHC: 33.2 g/dL (ref 30.0–36.0)
MCV: 77.6 fL — ABNORMAL LOW (ref 78.0–100.0)
PLATELETS: 197 10*3/uL (ref 150–400)
RBC: 5.9 MIL/uL — ABNORMAL HIGH (ref 3.87–5.11)
RDW: 14.7 % (ref 11.5–15.5)
WBC: 3.4 10*3/uL — ABNORMAL LOW (ref 4.0–10.5)

## 2016-05-10 LAB — I-STAT TROPONIN, ED: TROPONIN I, POC: 0 ng/mL (ref 0.00–0.08)

## 2016-05-10 LAB — URINE MICROSCOPIC-ADD ON

## 2016-05-10 LAB — I-STAT CG4 LACTIC ACID, ED
LACTIC ACID, VENOUS: 1.61 mmol/L (ref 0.5–1.9)
LACTIC ACID, VENOUS: 0.94 mmol/L (ref 0.5–1.9)

## 2016-05-10 LAB — LIPASE, BLOOD: Lipase: 34 U/L (ref 11–51)

## 2016-05-10 MED ORDER — LACTATED RINGERS IV BOLUS (SEPSIS)
1000.0000 mL | Freq: Once | INTRAVENOUS | Status: AC
  Administered 2016-05-10: 1000 mL via INTRAVENOUS

## 2016-05-10 MED ORDER — OXYCODONE-ACETAMINOPHEN 5-325 MG PO TABS
ORAL_TABLET | ORAL | Status: AC
  Filled 2016-05-10: qty 1

## 2016-05-10 MED ORDER — SODIUM CHLORIDE 0.9 % IV BOLUS (SEPSIS)
1000.0000 mL | Freq: Once | INTRAVENOUS | Status: AC
  Administered 2016-05-10: 1000 mL via INTRAVENOUS

## 2016-05-10 MED ORDER — ONDANSETRON 4 MG PO TBDP
4.0000 mg | ORAL_TABLET | Freq: Once | ORAL | Status: AC
  Administered 2016-05-10: 4 mg via ORAL

## 2016-05-10 MED ORDER — POTASSIUM CHLORIDE CRYS ER 20 MEQ PO TBCR
40.0000 meq | EXTENDED_RELEASE_TABLET | Freq: Once | ORAL | Status: AC
  Administered 2016-05-10: 40 meq via ORAL
  Filled 2016-05-10: qty 2

## 2016-05-10 MED ORDER — MAGNESIUM OXIDE 400 MG PO CAPS: 400.0000 mg | ORAL_CAPSULE | Freq: Every day | ORAL | 0 refills | Status: AC

## 2016-05-10 MED ORDER — FENTANYL CITRATE (PF) 100 MCG/2ML IJ SOLN
25.0000 ug | Freq: Once | INTRAMUSCULAR | Status: AC
  Administered 2016-05-10: 25 ug via INTRAVENOUS
  Filled 2016-05-10: qty 2

## 2016-05-10 MED ORDER — ONDANSETRON 4 MG PO TBDP
ORAL_TABLET | ORAL | Status: AC
  Filled 2016-05-10: qty 1

## 2016-05-10 MED ORDER — ONDANSETRON HCL 4 MG/2ML IJ SOLN
4.0000 mg | Freq: Once | INTRAMUSCULAR | Status: AC
  Administered 2016-05-10: 4 mg via INTRAVENOUS
  Filled 2016-05-10: qty 2

## 2016-05-10 MED ORDER — POTASSIUM CHLORIDE CRYS ER 20 MEQ PO TBCR: 40.0000 meq | EXTENDED_RELEASE_TABLET | Freq: Every day | ORAL | 0 refills | Status: DC

## 2016-05-10 MED ORDER — IOPAMIDOL (ISOVUE-300) INJECTION 61%
INTRAVENOUS | Status: AC
  Administered 2016-05-10: 100 mL
  Filled 2016-05-10: qty 100

## 2016-05-10 MED ORDER — OXYCODONE-ACETAMINOPHEN 5-325 MG PO TABS
1.0000 | ORAL_TABLET | ORAL | Status: DC | PRN
  Administered 2016-05-10: 1 via ORAL

## 2016-05-10 NOTE — ED Triage Notes (Signed)
Per pT, Pt started to have a sharp cramping abdominal pain that started yesterday with one episode of vomiting. Pt report generalized abdominal pain. Denies diarrhea. Complaining of nausea now.

## 2016-05-10 NOTE — ED Notes (Signed)
Pt informed of reason for delay. Pt also stated that "this Ibuprofen has worn off". Informed Hannah - RN.

## 2016-05-10 NOTE — ED Provider Notes (Signed)
[facility] DEPT Provider Note   CSN: [id number] Arrival date & time: 05/10/16  1344     History   Chief Complaint Chief Complaint  Patient presents with  . Abdominal Pain    HPI [name] is a 70 y.o. female.  HPI   70 yo F with PMHx of HTN, HLD, CAD who p/w intermittent, sharp, cramping abdominal pain. Pt states that her sx started gradually yesterday. She felt mildly nauseous upon awakening then developed progressively worsening cramp-like, sharp, intermittent, diffuse abdominal pain that was associated with worsening nausea and one episode of emesis. Denies any associated fevers or diarrhea. Pain seemed to be made worse with eating and palpation. Today, her pain has persisted so she decided to present to ED. No dysuria, hematuria, vaginal bleeding, vaginal discharge. No diarrhea. NO blood in emesis. She does have sick contacts with GI viruses. No CP, SOB, palpitations.  Past Medical History:  Diagnosis Date  . Allergy   . Arthritis   . Coronary artery disease   . High cholesterol   . Hypertension     There are no active problems to display for this patient.   Past Surgical History:  Procedure Laterality Date  . COLONOSCOPY    . MOUTH SURGERY     pt not sure what was removed under her toungue  . NECK SURGERY      OB History    No data available       Home Medications    Prior to Admission medications   Medication Sig Start Date End Date Taking? Authorizing Provider  atorvastatin (LIPITOR) 40 MG tablet Take 40 mg by mouth at bedtime.   Yes Historical Provider, MD  olmesartan (BENICAR) 40 MG tablet Take 40 mg by mouth daily.   Yes Historical Provider, MD  verapamil (VERELAN PM) 180 MG 24 hr capsule Take 360 mg by mouth daily. 04/05/16  Yes Historical Provider, MD  Magnesium Oxide 400 MG CAPS Take 1 capsule (400 mg total) by mouth daily. 05/10/16 05/13/16  Duffy Bruce, MD  potassium chloride SA (K-DUR,KLOR-CON) 20 MEQ tablet Take 2 tablets (40 mEq total)  by mouth daily. 05/10/16 05/13/16  Duffy Bruce, MD    Family History Family History  Problem Relation Age of Onset  . Stomach cancer Sister   . Colon cancer Neg Hx   . Esophageal cancer Neg Hx   . Rectal cancer Neg Hx     Social History Social History  Substance Use Topics  . Smoking status: Current Every Day Smoker    Packs/day: 0.25    Types: Cigarettes  . Smokeless tobacco: Never Used  . Alcohol use No     Allergies   Aspirin   Review of Systems Review of Systems  Constitutional: Positive for fatigue. Negative for chills and fever.  HENT: Negative for congestion, rhinorrhea and sore throat.   Eyes: Negative for visual disturbance.  Respiratory: Negative for cough, shortness of breath and wheezing.   Cardiovascular: Negative for chest pain and leg swelling.  Gastrointestinal: Positive for abdominal pain, nausea and vomiting. Negative for diarrhea.  Genitourinary: Negative for dysuria, flank pain, vaginal bleeding and vaginal discharge.  Musculoskeletal: Negative for neck pain.  Skin: Negative for rash.  Allergic/Immunologic: Negative for immunocompromised state.  Neurological: Negative for syncope and headaches.  Hematological: Does not bruise/bleed easily.  All other systems reviewed and are negative.    Physical Exam Updated Vital Signs BP 145/78   Pulse 63   Temp 98 F (36.7 C)  Resp 15   Ht 5' 1.5" (1.562 m)   Wt 162 lb 3.2 oz (73.6 kg)   SpO2 96%   BMI 30.15 kg/m   Physical Exam  Constitutional: She is oriented to person, place, and time. She appears well-developed and well-nourished. No distress.  HENT:  Head: Normocephalic and atraumatic.  Eyes: Conjunctivae are normal.  Neck: Neck supple.  Cardiovascular: Normal rate, regular rhythm and normal heart sounds.  Exam reveals no friction rub.   No murmur heard. Pulmonary/Chest: Effort normal and breath sounds normal. No respiratory distress. She has no wheezes. She has no rales.  Abdominal:  Normal appearance. She exhibits no distension. Bowel sounds are increased. There is generalized tenderness. There is no rigidity, no rebound, no guarding, no CVA tenderness, no tenderness at McBurney's point and negative Murphy's sign.  Musculoskeletal: She exhibits no edema.  Neurological: She is alert and oriented to person, place, and time. She exhibits normal muscle tone.  Skin: Skin is warm. Capillary refill takes less than 2 seconds.  Psychiatric: She has a normal mood and affect.  Nursing note and vitals reviewed.    ED Treatments / Results  Labs (all labs ordered are listed, but only abnormal results are displayed) Labs Reviewed  COMPREHENSIVE METABOLIC PANEL - Abnormal; Notable for the following:       Result Value   Potassium 3.2 (*)    Chloride 97 (*)    Glucose, Bld 106 (*)    Total Protein 9.0 (*)    GFR calc non Af Amer 57 (*)    All other components within normal limits  CBC - Abnormal; Notable for the following:    WBC 3.4 (*)    RBC 5.90 (*)    Hemoglobin 15.2 (*)    MCV 77.6 (*)    MCH 25.8 (*)    All other components within normal limits  URINALYSIS, ROUTINE W REFLEX MICROSCOPIC (NOT AT [facility]) - Abnormal; Notable for the following:    Color, Urine AMBER (*)    APPearance HAZY (*)    Specific Gravity, Urine 1.036 (*)    Hgb urine dipstick TRACE (*)    Bilirubin Urine SMALL (*)    Protein, ur 30 (*)    All other components within normal limits  URINE MICROSCOPIC-ADD ON - Abnormal; Notable for the following:    Squamous Epithelial / LPF 0-5 (*)    Bacteria, UA FEW (*)    Casts HYALINE CASTS (*)    All other components within normal limits  LIPASE, BLOOD  I-STAT CG4 LACTIC ACID, ED  I-STAT TROPOININ, ED  I-STAT CG4 LACTIC ACID, ED    EKG  EKG Interpretation  Date/Time:  Monday May 10 2016 22:08:20 EDT Ventricular Rate:  57 PR Interval:    QRS Duration: 102 QT Interval:  626 QTC Calculation: 610 R Axis:   45 Text Interpretation:  Sinus  rhythm Prolonged QT interval Since last tracing Diffuse TWI is now more evident Confirmed by Keasha Malkiewicz MD, Lysbeth Galas [phone]) on 05/11/2016 12:38:49 PM       Radiology Ct Abdomen Pelvis W Contrast  Result Date: 05/10/2016 CLINICAL DATA:  Generalized abdominal pain beginning yesterday with 1 episode of vomiting. EXAM: CT ABDOMEN AND PELVIS WITH CONTRAST TECHNIQUE: Multidetector CT imaging of the abdomen and pelvis was performed using the standard protocol following bolus administration of intravenous contrast. CONTRAST:  159mL ISOVUE-300 IOPAMIDOL (ISOVUE-300) INJECTION 61% COMPARISON:  Abdominal ultrasound 02/03/2007 FINDINGS: Lower chest: No acute abnormality. Hepatobiliary: Unremarkable appearance of the liver and  gallbladder. No biliary dilatation. Pancreas: Unremarkable. Spleen: Unremarkable. Adrenals/Urinary Tract: Unremarkable appearance of the adrenal glands. No hydronephrosis. 2.5 cm right upper pole renal cyst. Subcentimeter low-density lesions in both kidneys are too small to fully characterize. Vascular calcifications in the right renal hilum. Bladder is largely decompressed. Stomach/Bowel: Stomach is within normal limits. No evidence of bowel obstruction. Mild descending and sigmoid colon diverticulosis without evidence of diverticulitis. Unremarkable appendix. Vascular/Lymphatic: Diffuse atherosclerosis of the abdominal aorta and its major branch vessels. No abdominal aortic aneurysm. No enlarged lymph nodes. Reproductive: Uterus and bilateral adnexa are unremarkable. Other: No abdominal wall hernia or abnormality. No abdominopelvic ascites. Musculoskeletal: No acute osseous abnormality. No suspicious lytic or blastic osseous lesion. IMPRESSION: 1. No acute abnormality identified in the abdomen or pelvis. 2. Aortic atherosclerosis. Electronically Signed   By: Logan Bores M.D.   On: 05/10/2016 21:23    Procedures Procedures (including critical care time)  Medications Ordered in ED Medications    ondansetron (ZOFRAN-ODT) disintegrating tablet 4 mg (4 mg Oral Given 05/10/16 1432)  sodium chloride 0.9 % bolus 1,000 mL (0 mLs Intravenous Stopped 05/10/16 2245)  ondansetron (ZOFRAN) injection 4 mg (4 mg Intravenous Given 05/10/16 2024)  fentaNYL (SUBLIMAZE) injection 25 mcg (25 mcg Intravenous Given 05/10/16 2024)  iopamidol (ISOVUE-300) 61 % injection (100 mLs  Contrast Given 05/10/16 2047)  potassium chloride SA (K-DUR,KLOR-CON) CR tablet 40 mEq (40 mEq Oral Given 05/10/16 2252)  lactated ringers bolus 1,000 mL (0 mLs Intravenous Stopped 05/11/16 0037)     Initial Impression / Assessment and Plan / ED Course  I have reviewed the triage vital signs and the nursing notes.  Pertinent labs & imaging results that were available during my care of the patient were reviewed by me and considered in my medical decision making (see chart for details).  Clinical Course   70 yo AAF with PMHx as above who p/w intermittent, sharp, aching abdominal pain. Known sick contacts. On arrival, VSS and WNL for pt. Exam is as above, she has mild diffuse abdominal TTP wit hyperactive BS but no specific TTP, no rebound or guarding. DDx includes: viral GI illness, gastritis, enteritis, colitis, diverticulitis, less likely appendicitis. No RUQ TTP, negative Murphy's, doubt appendicitis. No hypotension or signs of sepsis. She does appear mildly dehydrated. Will check labs, give fluids, and obtain CT given age, diffuse abdominal pain. No CP, SOB, palpitations, diaphoresis, or signs of ACS. 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  Labs, imaging as above. CBC shows mild chronic leukopenia, which is not acutely changed. No neutropenia. CMP shows chronci hypoK but o/w unremarkable. Normal renal function, LFTs, and bili. UA shows dehydration but no signs of UTI.  Screening trop negative, reasuring in setting of >24 hr of constant sx and repeat EKG is unchanged - do not suspect ACS or cardiac etiology at this time. Lipase normal. CT scan shows no acute abnormalities.  Pt sx markedly improved with IVF, and she is requesting d/c. Given stable vitals, resolution of sx, tolerance of PO, and reassuring labs and imaging, believe this is appropriate. Will d/c with supportive care, IVF, and outpt follow-up. 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 I discussed against Zofran, other QT prolonging agents given her QT on EKG and hse will f/u with PCP.  Final Clinical Impressions(s) / ED Diagnoses  Final diagnoses:  Generalized abdominal pain  Dehydration    New Prescriptions Discharge Medication List as of 05/10/2016 11:54 PM    START taking these medications   Details  Magnesium Oxide 400 MG CAPS Take 1 capsule (400 mg total) by mouth daily., Starting Mon 05/10/2016, Until Thu 05/13/2016, Print    potassium chloride SA (K-DUR,KLOR-CON) 20 MEQ tablet Take 2 tablets (40 mEq total) by mouth daily., Starting Mon 05/10/2016, Until Thu 05/13/2016, Print         Duffy Bruce, MD 05/11/16 [phone]

## 2016-05-11 NOTE — ED Notes (Signed)
Outstanding balance of Fentanyl located in this RN's pocket. 49mcg of Fentanyl wasted in sink with Latrelle Dodrill, RN.

## 2016-05-14 ENCOUNTER — Inpatient Hospital Stay (HOSPITAL_COMMUNITY)
Admission: EM | Admit: 2016-05-14 | Discharge: 2016-05-18 | DRG: 247 | Disposition: A | Discharge status: Home / Self Care | Payer: Medicare Other | Attending: Cardiovascular Disease | Admitting: Cardiovascular Disease

## 2016-05-14 ENCOUNTER — Emergency Department (HOSPITAL_COMMUNITY): Payer: Medicare Other

## 2016-05-14 ENCOUNTER — Encounter (HOSPITAL_COMMUNITY): Payer: Self-pay

## 2016-05-14 DIAGNOSIS — E785 Hyperlipidemia, unspecified: Secondary | ICD-10-CM | POA: Diagnosis not present

## 2016-05-14 DIAGNOSIS — Z87891 Personal history of nicotine dependence: Secondary | ICD-10-CM

## 2016-05-14 DIAGNOSIS — R7989 Other specified abnormal findings of blood chemistry: Secondary | ICD-10-CM | POA: Diagnosis not present

## 2016-05-14 DIAGNOSIS — I25119 Atherosclerotic heart disease of native coronary artery with unspecified angina pectoris: Secondary | ICD-10-CM | POA: Diagnosis present

## 2016-05-14 DIAGNOSIS — I25118 Atherosclerotic heart disease of native coronary artery with other forms of angina pectoris: Secondary | ICD-10-CM | POA: Diagnosis not present

## 2016-05-14 DIAGNOSIS — I350 Nonrheumatic aortic (valve) stenosis: Secondary | ICD-10-CM | POA: Diagnosis present

## 2016-05-14 DIAGNOSIS — E7800 Pure hypercholesterolemia, unspecified: Secondary | ICD-10-CM | POA: Diagnosis present

## 2016-05-14 DIAGNOSIS — R0781 Pleurodynia: Secondary | ICD-10-CM | POA: Diagnosis present

## 2016-05-14 DIAGNOSIS — Z886 Allergy status to analgesic agent status: Secondary | ICD-10-CM | POA: Diagnosis not present

## 2016-05-14 DIAGNOSIS — R072 Precordial pain: Secondary | ICD-10-CM | POA: Diagnosis present

## 2016-05-14 DIAGNOSIS — I10 Essential (primary) hypertension: Secondary | ICD-10-CM | POA: Diagnosis present

## 2016-05-14 DIAGNOSIS — R778 Other specified abnormalities of plasma proteins: Secondary | ICD-10-CM

## 2016-05-14 DIAGNOSIS — R9431 Abnormal electrocardiogram [ECG] [EKG]: Secondary | ICD-10-CM

## 2016-05-14 DIAGNOSIS — Z955 Presence of coronary angioplasty implant and graft: Secondary | ICD-10-CM

## 2016-05-14 DIAGNOSIS — R05 Cough: Secondary | ICD-10-CM | POA: Diagnosis present

## 2016-05-14 DIAGNOSIS — R079 Chest pain, unspecified: Secondary | ICD-10-CM

## 2016-05-14 DIAGNOSIS — I214 Non-ST elevation (NSTEMI) myocardial infarction: Principal | ICD-10-CM | POA: Diagnosis present

## 2016-05-14 DIAGNOSIS — R0602 Shortness of breath: Secondary | ICD-10-CM | POA: Diagnosis not present

## 2016-05-14 HISTORY — DX: Personal history of other medical treatment: Z92.89

## 2016-05-14 LAB — I-STAT TROPONIN, ED: TROPONIN I, POC: 0.37 ng/mL — AB (ref 0.00–0.08)

## 2016-05-14 LAB — CBC
HEMATOCRIT: 45.2 % (ref 36.0–46.0)
HEMOGLOBIN: 14.8 g/dL (ref 12.0–15.0)
MCH: 25.5 pg — AB (ref 26.0–34.0)
MCHC: 32.7 g/dL (ref 30.0–36.0)
MCV: 77.8 fL — ABNORMAL LOW (ref 78.0–100.0)
Platelets: 195 10*3/uL (ref 150–400)
RBC: 5.81 MIL/uL — AB (ref 3.87–5.11)
RDW: 15.1 % (ref 11.5–15.5)
WBC: 4.1 10*3/uL (ref 4.0–10.5)

## 2016-05-14 LAB — BASIC METABOLIC PANEL
Anion gap: 9 (ref 5–15)
BUN: 9 mg/dL (ref 6–20)
CO2: 26 mmol/L (ref 22–32)
CREATININE: 0.8 mg/dL (ref 0.44–1.00)
Calcium: 10.3 mg/dL (ref 8.9–10.3)
Chloride: 101 mmol/L (ref 101–111)
Glucose, Bld: 98 mg/dL (ref 65–99)
POTASSIUM: 4.2 mmol/L (ref 3.5–5.1)
SODIUM: 136 mmol/L (ref 135–145)

## 2016-05-14 LAB — CK TOTAL AND CKMB (NOT AT ARMC)
CK, MB: 3.4 ng/mL (ref 0.5–5.0)
Relative Index: 1.4 (ref 0.0–2.5)
Total CK: 241 U/L — ABNORMAL HIGH (ref 38–234)

## 2016-05-14 LAB — TROPONIN I
TROPONIN I: 0.46 ng/mL — AB (ref <0.03)
TROPONIN I: 0.45 ng/mL — AB (ref <0.03)

## 2016-05-14 LAB — HEPARIN LEVEL (UNFRACTIONATED): HEPARIN UNFRACTIONATED: 0.61 [IU]/mL (ref 0.30–0.70)

## 2016-05-14 MED ORDER — HEPARIN BOLUS VIA INFUSION
3500.0000 [IU] | Freq: Once | INTRAVENOUS | Status: AC
  Administered 2016-05-14: 3500 [IU] via INTRAVENOUS
  Filled 2016-05-14: qty 3500

## 2016-05-14 MED ORDER — ZOLPIDEM TARTRATE 5 MG PO TABS
5.0000 mg | ORAL_TABLET | Freq: Every evening | ORAL | Status: DC | PRN
  Administered 2016-05-14 – 2016-05-17 (×2): 5 mg via ORAL
  Filled 2016-05-14 (×2): qty 1

## 2016-05-14 MED ORDER — HEPARIN (PORCINE) IN NACL 100-0.45 UNIT/ML-% IJ SOLN: 12.0000 [IU]/kg/h | INTRAMUSCULAR | Status: DC

## 2016-05-14 MED ORDER — SODIUM CHLORIDE 0.9 % IV SOLN
250.0000 mL | INTRAVENOUS | Status: DC | PRN
  Administered 2016-05-15: 250 mL via INTRAVENOUS

## 2016-05-14 MED ORDER — ONDANSETRON HCL 4 MG/2ML IJ SOLN: 4.0000 mg | Freq: Four times a day (QID) | INTRAMUSCULAR | Status: DC | PRN

## 2016-05-14 MED ORDER — POTASSIUM CHLORIDE CRYS ER 20 MEQ PO TBCR: 40.0000 meq | EXTENDED_RELEASE_TABLET | Freq: Every day | ORAL | Status: DC

## 2016-05-14 MED ORDER — IPRATROPIUM-ALBUTEROL 0.5-2.5 (3) MG/3ML IN SOLN
3.0000 mL | Freq: Four times a day (QID) | RESPIRATORY_TRACT | Status: DC
  Administered 2016-05-14 – 2016-05-16 (×8): 3 mL via RESPIRATORY_TRACT
  Filled 2016-05-14 (×8): qty 3

## 2016-05-14 MED ORDER — GUAIFENESIN ER 600 MG PO TB12
1200.0000 mg | ORAL_TABLET | Freq: Two times a day (BID) | ORAL | Status: DC
  Administered 2016-05-14 – 2016-05-18 (×7): 1200 mg via ORAL
  Filled 2016-05-14 (×8): qty 2

## 2016-05-14 MED ORDER — POTASSIUM CHLORIDE CRYS ER 20 MEQ PO TBCR
40.0000 meq | EXTENDED_RELEASE_TABLET | Freq: Every day | ORAL | Status: DC
  Administered 2016-05-15 – 2016-05-18 (×4): 40 meq via ORAL
  Filled 2016-05-14 (×3): qty 2
  Filled 2016-05-14: qty 4

## 2016-05-14 MED ORDER — IPRATROPIUM-ALBUTEROL 0.5-2.5 (3) MG/3ML IN SOLN: 3.0000 mL | Freq: Four times a day (QID) | RESPIRATORY_TRACT | Status: DC

## 2016-05-14 MED ORDER — ATORVASTATIN CALCIUM 40 MG PO TABS
40.0000 mg | ORAL_TABLET | Freq: Every day | ORAL | Status: DC
  Administered 2016-05-14 – 2016-05-16 (×3): 40 mg via ORAL
  Filled 2016-05-14 (×3): qty 1

## 2016-05-14 MED ORDER — IRBESARTAN 75 MG PO TABS
37.5000 mg | ORAL_TABLET | Freq: Every day | ORAL | Status: DC
  Administered 2016-05-15 – 2016-05-18 (×3): 37.5 mg via ORAL
  Filled 2016-05-14 (×4): qty 1

## 2016-05-14 MED ORDER — NITROGLYCERIN 2 % TD OINT
1.0000 [in_us] | TOPICAL_OINTMENT | Freq: Four times a day (QID) | TRANSDERMAL | Status: DC
  Administered 2016-05-15: 1 [in_us] via TOPICAL
  Filled 2016-05-14: qty 30

## 2016-05-14 MED ORDER — VERAPAMIL HCL ER 180 MG PO TBCR
360.0000 mg | EXTENDED_RELEASE_TABLET | Freq: Every day | ORAL | Status: DC
  Administered 2016-05-15 – 2016-05-16 (×2): 360 mg via ORAL
  Filled 2016-05-14 (×3): qty 2

## 2016-05-14 MED ORDER — SODIUM CHLORIDE 0.9% FLUSH: 3.0000 mL | INTRAVENOUS | Status: DC | PRN

## 2016-05-14 MED ORDER — HEPARIN SODIUM (PORCINE) 5000 UNIT/ML IJ SOLN: 4000.0000 [IU] | Freq: Once | INTRAMUSCULAR | Status: DC

## 2016-05-14 MED ORDER — HYDROCOD POLST-CPM POLST ER 10-8 MG/5ML PO SUER
5.0000 mL | Freq: Two times a day (BID) | ORAL | Status: DC | PRN
  Administered 2016-05-16 – 2016-05-17 (×3): 5 mL via ORAL
  Filled 2016-05-14 (×3): qty 5

## 2016-05-14 MED ORDER — ACETAMINOPHEN 325 MG PO TABS: 650.0000 mg | ORAL_TABLET | ORAL | Status: DC | PRN

## 2016-05-14 MED ORDER — SODIUM CHLORIDE 0.9% FLUSH
3.0000 mL | Freq: Two times a day (BID) | INTRAVENOUS | Status: DC
  Administered 2016-05-15 – 2016-05-17 (×2): 3 mL via INTRAVENOUS

## 2016-05-14 MED ORDER — HEPARIN (PORCINE) IN NACL 100-0.45 UNIT/ML-% IJ SOLN
800.0000 [IU]/h | INTRAMUSCULAR | Status: DC
Start: 2016-05-14 — End: 2016-05-17
  Administered 2016-05-14 – 2016-05-16 (×3): 800 [IU]/h via INTRAVENOUS
  Filled 2016-05-14 (×3): qty 250

## 2016-05-14 MED ORDER — NITROGLYCERIN 2 % TD OINT
0.5000 [in_us] | TOPICAL_OINTMENT | Freq: Once | TRANSDERMAL | Status: AC
  Administered 2016-05-14: 0.5 [in_us] via TOPICAL
  Filled 2016-05-14: qty 1

## 2016-05-14 MED ORDER — NITROGLYCERIN 0.4 MG SL SUBL: 0.4000 mg | SUBLINGUAL_TABLET | SUBLINGUAL | Status: DC | PRN

## 2016-05-14 MED ORDER — ALPRAZOLAM 0.25 MG PO TABS
0.2500 mg | ORAL_TABLET | Freq: Two times a day (BID) | ORAL | Status: DC | PRN
  Administered 2016-05-15: 0.25 mg via ORAL
  Filled 2016-05-14: qty 1

## 2016-05-14 NOTE — Progress Notes (Signed)
ANTICOAGULATION CONSULT NOTE - Initial Consult  Pharmacy Consult for heparin Indication: chest pain/ACS  Allergies  Allergen Reactions  . Aspirin Shortness Of Breath and Swelling    Patient Measurements:   Heparin Dosing Weight: ~64kg  Vital Signs: Temp: 98.3 F (36.8 C) (09/15 1248) Temp Source: Oral (09/15 1248) BP: 162/73 (09/15 1248) Pulse Rate: 57 (09/15 1248)  Labs: No results for input(s): HGB, HCT, PLT, APTT, LABPROT, INR, HEPARINUNFRC, HEPRLOWMOCWT, CREATININE, CKTOTAL, CKMB, TROPONINI in the last 72 hours.  Estimated Creatinine Clearance: 49.6 mL/min (by C-G formula based on SCr of 0.98 mg/dL).   Medical History: Past Medical History:  Diagnosis Date  . Allergy   . Arthritis   . Coronary artery disease   . High cholesterol   . Hypertension     Assessment: 63 yof with CP on admit. Pharmacy consulted to dose heparin for ACS. Not on anticoagulation PTA. No labs yet, CBC ok on 9/11. No bleed documented.  Goal of Therapy:  Heparin level 0.3-0.7 units/ml Monitor platelets by anticoagulation protocol: Yes   Plan:  Heparin 3500 unit bolus Start heparin at 800 units/h 8h heparin level Daily heparin level/CBC Mon s/sx bleeding

## 2016-05-14 NOTE — Progress Notes (Signed)
[facility] for heparin Indication: chest pain/ACS  Allergies  Allergen Reactions  . Aspirin Shortness Of Breath and Swelling    Patient Measurements: Height: 5' 1.5" (156.2 cm) Weight: 157 lb 9.6 oz (71.5 kg) IBW/kg (Calculated) : 48.95 Heparin Dosing Weight: ~64kg  Vital Signs: Temp: 99.2 F (37.3 C) (09/15 2025) Temp Source: Oral (09/15 2025) BP: 131/63 (09/15 2025) Pulse Rate: 73 (09/15 2025)  Labs:  Recent Labs  05/14/16 1250 05/14/16 1335 05/14/16 1900 05/14/16 2131  HGB 14.8  --   --   --   HCT 45.2  --   --   --   PLT 195  --   --   --   HEPARINUNFRC  --   --   --  0.61  CREATININE  --  0.80  --   --   CKTOTAL  --   --  241*  --   CKMB  --   --  3.4  --   TROPONINI  --   --  0.46*  --     Estimated Creatinine Clearance: 59.9 mL/min (by C-G formula based on SCr of 0.8 mg/dL).  Assessment: 55 yof with CP on admit. Pharmacy consulted to dose heparin for ACS. Not on anticoagulation PTA. No labs yet, CBC ok on 9/11. No bleed documented.  Initial heparin level therapeutic at 0.61  Goal of Therapy:  Heparin level 0.3-0.7 units/ml Monitor platelets by anticoagulation protocol: Yes   Plan:  Continue heparin at 800 units / hr Daily heparin level/CBC Mon s/sx bleeding  Thank you Anette Guarneri, PharmD [phone]

## 2016-05-14 NOTE — ED Provider Notes (Signed)
Emergency Department Provider Note   I have reviewed the triage vital signs and the nursing notes.   HISTORY  Chief Complaint Chest Pain   HPI [name] is a 70 y.o. female with PMH of HTN and HLD presents to the ED with chest pain starting last night. The patient endorses some abdominal discomfort earlier in the week along with cough. The abdominal pain has resolved but the cough has persisted. The patient reports chest pain that started last night and radiates to the left shoulder and arm. She reports worsening pain with coughing. Also notes subjective fever and nausea but no vomiting. No diaphoresis. No abdominal pain or diarrhea. She has some associated dyspnea, especially with exertion.    Past Medical History:  Diagnosis Date  . Allergy   . Arthritis   . H/O echocardiogram 2008   EF 65%, no WMA  . High cholesterol   . Hypertension     Patient Active Problem List   Diagnosis Date Noted  . Precordial chest pain 05/14/2016  . Elevated troponin 05/14/2016  . SOB (shortness of breath) 05/14/2016  . Abnormal ECG 05/14/2016  . Chest pain, moderate coronary artery risk 05/14/2016    Past Surgical History:  Procedure Laterality Date  . CESAREAN SECTION    . COLONOSCOPY    . MOUTH SURGERY     pt not sure what was removed under her toungue  . NECK SURGERY        Allergies Aspirin  Family History  Problem Relation Age of Onset  . Stomach cancer Sister   . Colon cancer Neg Hx   . Esophageal cancer Neg Hx   . Rectal cancer Neg Hx     Social History Social History  Substance Use Topics  . Smoking status: Former Smoker    Packs/day: 0.25    Types: Cigarettes    Quit date: 05/07/2016  . Smokeless tobacco: Never Used     Comment: Suddenly could not stand the smell  . Alcohol use No    Review of Systems  Constitutional: No fever/chills Eyes: No visual changes. ENT: No sore throat. Cardiovascular: Positive chest pain. Respiratory: Positive shortness  of breath and cough Gastrointestinal: No abdominal pain. Positive nausea, no vomiting.  No diarrhea.  No constipation. Genitourinary: Negative for dysuria. Musculoskeletal: Negative for back pain. Skin: Negative for rash. Neurological: Negative for headaches, focal weakness or numbness.  10-point ROS otherwise negative.  ____________________________________________   PHYSICAL EXAM:  VITAL SIGNS: ED Triage Vitals  Enc Vitals Group     BP 05/14/16 1248 162/73     Pulse Rate 05/14/16 1248 (!) 57     Resp 05/14/16 1248 15     Temp 05/14/16 1248 98.3 F (36.8 C)     Temp Source 05/14/16 1248 Oral     SpO2 05/14/16 1248 91 %     Pain Score 05/14/16 1241 6   Constitutional: Alert and oriented. Well appearing and in no acute distress. Eyes: Conjunctivae are normal.  Head: Atraumatic. Nose: No congestion/rhinnorhea. Mouth/Throat: Mucous membranes are moist.  Oropharynx non-erythematous. Neck: No stridor.   Cardiovascular: Normal rate, regular rhythm. Good peripheral circulation. Grossly normal heart sounds. Tenderness to palpation over the anterior chest wall.  Respiratory: Normal respiratory effort.  No retractions. Lungs CTAB. Gastrointestinal: Soft and nontender. No distention.  Musculoskeletal: No lower extremity tenderness nor edema. No gross deformities of extremities. Neurologic:  Normal speech and language. No gross focal neurologic deficits are appreciated.  Skin:  Skin is warm,  dry and intact. No rash noted. Psychiatric: Mood and affect are normal. Speech and behavior are normal.  ____________________________________________   LABS (all labs ordered are listed, but only abnormal results are displayed)  Labs Reviewed  CBC - Abnormal; Notable for the following:       Result Value   RBC 5.81 (*)    MCV 77.8 (*)    MCH 25.5 (*)    All other components within normal limits  I-STAT TROPOININ, ED - Abnormal; Notable for the following:    Troponin i, poc 0.37 (*)    All  other components within normal limits  BASIC METABOLIC PANEL  HEPARIN LEVEL (UNFRACTIONATED)  TROPONIN I  TROPONIN I  TROPONIN I  CK TOTAL AND CKMB (NOT AT [facility])  CBC  COMPREHENSIVE METABOLIC PANEL  LIPID PANEL  I-STAT TROPOININ, ED   ____________________________________________  EKG   EKG Interpretation  Date/Time:  Friday May 14 2016 15:56:45 EDT Ventricular Rate:  62 PR Interval:  142 QRS Duration: 100 QT Interval:  494 QTC Calculation: 502 R Axis:   44 Text Interpretation:  Sinus rhythm Borderline low voltage, extremity leads Abnormal T, consider ischemia, diffuse leads Prolonged QT interval inferior ischemic changes since previous. Confirmed by Johnney Killian, MD, Jeannie Done [phone]) on 05/14/2016 4:05:48 PM       ____________________________________________  RADIOLOGY  Dg Chest 2 View  Result Date: 05/14/2016 CLINICAL DATA:  Chest pain and cough for 2 weeks. EXAM: CHEST  2 VIEW COMPARISON:  09/28/2011 FINDINGS: The cardiac silhouette, mediastinal and hilar contours are within normal limits stable. There is moderate tortuosity of the thoracic aorta. Streaky bibasilar atelectasis but no infiltrates, edema or effusions. The bony thorax is intact. IMPRESSION: Streaky bibasilar atelectasis but no infiltrates, edema or effusions. Electronically Signed   By: Marijo Sanes M.D.   On: 05/14/2016 13:06    ____________________________________________   PROCEDURES  Procedure(s) performed:   Procedures   ____________________________________________   INITIAL IMPRESSION / ASSESSMENT AND PLAN / ED COURSE  Pertinent labs & imaging results that were available during my care of the patient were reviewed by me and considered in my medical decision making (see chart for details).  Patient presents to the ED with chest pain since last night that is radiating to the left shoulder and arm. Patient has had cough for the last week with subjective fever and tenderness to palpation of the  chest wall. She is at elevated risk for ACS with age and multiple risk factors. Stopped smoking 1 week prior. Plan for ACS r/o and pain control with nitroglycerine. EKG reviewed with no STEMI. Patient notes allergy to ASA.   01:39 PM Patient with troponin of 0.37. Paged cardiology to discuss. Placing nitroglycerine on the chest now. Updated patient at bedside regarding lab results. Will begin heparin infusion with concern for NSTEMI.   02:10 PM Spoke with Cardiology regarding elevated troponin. They will be down to see.  ____________________________________________  FINAL CLINICAL IMPRESSION(S) / ED DIAGNOSES  Final diagnoses:  NSTEMI (non-ST elevated myocardial infarction) ([place])     MEDICATIONS GIVEN DURING THIS VISIT:  Medications  heparin ADULT infusion 100 units/mL (25000 units/278mL sodium chloride 0.45%) (800 Units/hr Intravenous New Bag/Given 05/14/16 1412)  nitroGLYCERIN (NITROSTAT) SL tablet 0.4 mg (not administered)  acetaminophen (TYLENOL) tablet 650 mg (not administered)  ondansetron (ZOFRAN) injection 4 mg (not administered)  zolpidem (AMBIEN) tablet 5 mg (not administered)  sodium chloride flush (NS) 0.9 % injection 3 mL (not administered)  sodium chloride flush (NS) 0.9 % injection  3 mL (not administered)  0.9 %  sodium chloride infusion (not administered)  ALPRAZolam (XANAX) tablet 0.25 mg (not administered)  atorvastatin (LIPITOR) tablet 40 mg (not administered)  irbesartan (AVAPRO) tablet 37.5 mg (not administered)  verapamil (CALAN-SR) CR tablet 360 mg (not administered)  guaiFENesin (MUCINEX) 12 hr tablet 1,200 mg (not administered)  chlorpheniramine-HYDROcodone (TUSSIONEX) 10-8 MG/5ML suspension 5 mL (not administered)  nitroGLYCERIN (NITROGLYN) 2 % ointment 1 inch (not administered)  potassium chloride SA (K-DUR,KLOR-CON) CR tablet 40 mEq (not administered)  ipratropium-albuterol (DUONEB) 0.5-2.5 (3) MG/3ML nebulizer solution 3 mL (not administered)    nitroGLYCERIN (NITROGLYN) 2 % ointment 0.5 inch (0.5 inches Topical Given 05/14/16 1349)  heparin bolus via infusion 3,500 Units (3,500 Units Intravenous Bolus from Bag 05/14/16 1414)     NEW OUTPATIENT MEDICATIONS STARTED DURING THIS VISIT:  None   Note:  This document was prepared using Dragon voice recognition software and may include unintentional dictation errors.  Nanda Quinton, MD Emergency Medicine  Margette Fast, MD 05/14/16 [phone]

## 2016-05-14 NOTE — ED Triage Notes (Signed)
Per pt, pt is coming from home with complaints of mid-center chest pain that started this morning. Complains of SOB and dizziness that accompany the pain. Pt is alert and oriented x4 upon the arrival to the ED.

## 2016-05-14 NOTE — ED Notes (Signed)
Pt became unsteady on her feet while walking into the ED lobby.  Assisted to a wheelchair without incident.

## 2016-05-14 NOTE — H&P (Signed)
CARDIOLOGY HISTORY AND PHYSICAL   Patient ID: [name] MRN: [id number], DOB/AGE: 1946/01/30 70 y.o. Date of Encounter: 05/14/2016  Primary Physician: Marton Redwood, MD Primary Cardiologist: Dr Johnsie Cancel 2008  Consulting MD: Dr Juleen Starr   Chief Complaint:  Chest pain and SOB  HPI: [name] is a 70 y.o. female with a history of HTN, HLD, tob use. Eval for atypical CP 2008 was done with echo, normal. Pt has not seen cardiology since then, no stress test or echo.  [name] got her flu shot recently, she thinks in late August.  For the last week, [name] has been feeling bad. She suddenly could not stand the taste of cigarettes, stopped cold [place]. She was coughing a lot. C/o general malaise, dizziness and weakness (?orthostatic) that is episodic. Never starts at rest, happens when she is doing things. She will sit down and try to rest, this may help. She thinks she has had fevers, has not taken her temp.   She feels her heart skip or race at times, thinks this is associated with the dizziness, but not sure.  She developed GI sx and felt so bad on 09/11 that she came the ER. She was having abdominal pain, N&V, Diarrhea, ?food poisoning or GI bug. Was given IVF and other rx, sx improved and d/c home. She has been feeling a little better since then but po intake has been poor, she has not felt like eating. She has not taken her meds because she cannot take them on an empty stomach. She had diarrhea last pm and this am, but no more N&V.  Chest pain started last pm, sharp and burning, 7-8/10, +SOB, she gets nauseated and sweaty. The pain will start when she starts coughing and hurts onl with cough. Cough is productive of yellow sputum. She never gets any other chest pain. Her breathing has been getting worse for about a week. She has been wheezing more than usual, inhalers are helping, not relieving it.  She called her MD today because of the chest pain and SOB. She came to the  ER and feels better after nitro ointment, heparin. She is still having 6/10 chest pain when she breathes.   Past Medical History:  Diagnosis Date  . Allergy   . Arthritis   . H/O echocardiogram 2008   EF 65%, no WMA  . High cholesterol   . Hypertension     Surgical History:  Past Surgical History:  Procedure Laterality Date  . CESAREAN SECTION    . COLONOSCOPY    . MOUTH SURGERY     pt not sure what was removed under her toungue  . NECK SURGERY       I have reviewed the patient's current medications. Prior to Admission medications   Medication Sig Start Date End Date Taking? Authorizing Provider  atorvastatin (LIPITOR) 40 MG tablet Take 40 mg by mouth at bedtime.   Yes Historical Provider, MD  olmesartan (BENICAR) 40 MG tablet Take 40 mg by mouth daily.   Yes Historical Provider, MD  potassium chloride SA (K-DUR,KLOR-CON) 20 MEQ tablet Take 2 tablets (40 mEq total) by mouth daily. 05/10/16 05/14/17 Yes Duffy Bruce, MD  verapamil (VERELAN PM) 180 MG 24 hr capsule Take 360 mg by mouth daily. 04/05/16  Yes Historical Provider, MD   Scheduled Meds:  Continuous Infusions: . heparin 800 Units/hr (05/14/16 1412)   PRN Meds:.  Allergies:  Allergies  Allergen Reactions  . Aspirin  Shortness Of Breath and Swelling    Social History   Social History  . Marital status: Single    Spouse name: N/A  . Number of children: N/A  . Years of education: N/A   Occupational History  . Retired from [employer] work    Social History Main Topics  . Smoking status: Former Smoker    Packs/day: 0.25    Types: Cigarettes    Quit date: 05/07/2016  . Smokeless tobacco: Never Used     Comment: Suddenly could not stand the smell  . Alcohol use No  . Drug use: No  . Sexual activity: Not on file   Other Topics Concern  . Not on file   Social History Narrative   Pt lives alone in [place], sister and a son live nearby.    Family History  Problem Relation Age of Onset  . Stomach cancer  Sister   . Colon cancer Neg Hx   . Esophageal cancer Neg Hx   . Rectal cancer Neg Hx    Family Status  Relation Status  . Sister Deceased  . Mother Deceased  . Father Deceased  . Neg Hx     Review of Systems:   Full 14-point review of systems otherwise negative except as noted above.  Physical Exam: Blood pressure 126/93, pulse 70, temperature 98.3 F (36.8 C), temperature source Oral, resp. rate 19, SpO2 96 %. General: Well developed, well nourished,female in no acute distress. Head: Normocephalic, atraumatic, sclera non-icteric, no xanthomas, nares are without discharge. Dentition: poor Neck: No carotid bruits. JVD minimally elevated. No thyromegally Lungs: Good expansion bilaterally. without wheezes, bibasilar rales, some rhonchi Heart: Regular rate and rhythm with S1 S2.  No S3 or S4.  Soft murmur, no rubs, or gallops appreciated. Abdomen: Soft, tender RUQ, non-distended with normoactive bowel sounds. No hepatomegaly. No rebound/guarding. No obvious abdominal masses. Msk:  Strength and tone appear normal for age. No joint deformities or effusions, no spine or costo-vertebral angle tenderness. Extremities: No clubbing or cyanosis. No edema.  Distal pedal pulses are 2+ in 4 extrem Neuro: Alert and oriented X 3. Moves all extremities spontaneously. No focal deficits noted. Psych:  Responds to questions appropriately with a normal affect. Skin: No rashes or lesions noted  Labs:  Lab Results  Component Value Date   WBC 4.1 05/14/2016   HGB 14.8 05/14/2016   HCT 45.2 05/14/2016   MCV 77.8 (L) 05/14/2016   PLT 195 05/14/2016     Recent Labs Lab 05/10/16 1425 05/14/16 1335  NA 135 136  K 3.2* 4.2  CL 97* 101  CO2 26 26  BUN 14 9  CREATININE 0.98 0.80  CALCIUM 10.1 10.3  PROT 9.0*  --   BILITOT 0.6  --   ALKPHOS 95  --   ALT 21  --   AST 29  --   GLUCOSE 106* 98    Recent Labs  05/14/16 1323  TROPIPOC 0.37*    Radiology/Studies: Dg Chest 2 View Result  Date: 05/14/2016 CLINICAL DATA:  Chest pain and cough for 2 weeks. EXAM: CHEST  2 VIEW COMPARISON:  09/28/2011 FINDINGS: The cardiac silhouette, mediastinal and hilar contours are within normal limits stable. There is moderate tortuosity of the thoracic aorta. Streaky bibasilar atelectasis but no infiltrates, edema or effusions. The bony thorax is intact. IMPRESSION: Streaky bibasilar atelectasis but no infiltrates, edema or effusions. Electronically Signed   By: Marijo Sanes M.D.   On: 05/14/2016 13:06    Echo: 01/2007  SUMMARY - Overall left ventricular systolic function was normal. Left    ventricular ejection fraction was estimated to be 65 %. There    was no diagnostic evidence of left ventricular regional wall    motion abnormalities. - The mean transaortic valve gradient was 9 mmHg.  ECG: 09/15 SR, lateral T wave changes slightly different from 09/11 which are different from 2013  ASSESSMENT AND PLAN:  Principal Problem:   SOB (shortness of breath) - pt has been ill recently with productive cough, subjective fevers - CXR without edema but will ck BNP - will add high-dose Mucinex, bronchodilators - hold off on ABX with nl WBC and no infiltrate on CXR - Tussionex for cough - if wheezing worsens, add steroids - follow for sx  Active Problems:   Precordial chest pain - sx are very atypical, follow - continue heparin and nitrates    Elevated troponin   Abnormal ECG - unclear cause as chest pain is very atypical - continue nitro paste, no ASA 2nd allergy - no BB with resp issues - cycle ez including a CKMB, ck echo - if ez trend down and echo is normal, ok for OP MV - any abnormalities on echo or ez trend up, continue full anticoagulation and cath Monday  Will initially do as inpatient, if does not need cath, change to observation,  Signed, Lenoard Aden 05/14/2016 3:54 PM Beeper [id number]  Agree with note by Rosaria Ferries PA-C  Pt with long  H/O tobacco abuse (recently stopped) c/o a month of feeling fatigued but no CP/SOB. She was in the ER on 9/11 with GI Sx. Her EKG showed new AL TWI. Last night she developed pleuritic CP with increased SOB. Her POCM shows Trop of .37 and her EKG shows near resolution of her recent TWI. Exam is notable for upper airway sounds, rhonchi and wheezes but otherwise is benign. CXR NAD. Will admit to tele, heparinize, topical NTG, cycle enz, 2D. Sx are atyp but with long H/O tob and dynamic EKG changes need to R/O CAD. Check D- dimer as well.  Lorretta Harp, M.D., [place], [facility], [place] [address] [place], [place]  [id number]  [phone] 05/14/2016 4:33 PM

## 2016-05-15 ENCOUNTER — Inpatient Hospital Stay (HOSPITAL_COMMUNITY): Payer: Medicare Other

## 2016-05-15 DIAGNOSIS — R9431 Abnormal electrocardiogram [ECG] [EKG]: Secondary | ICD-10-CM

## 2016-05-15 DIAGNOSIS — R072 Precordial pain: Secondary | ICD-10-CM

## 2016-05-15 DIAGNOSIS — R079 Chest pain, unspecified: Secondary | ICD-10-CM

## 2016-05-15 DIAGNOSIS — Z720 Tobacco use: Secondary | ICD-10-CM

## 2016-05-15 DIAGNOSIS — I214 Non-ST elevation (NSTEMI) myocardial infarction: Principal | ICD-10-CM

## 2016-05-15 LAB — LIPID PANEL
CHOL/HDL RATIO: 5.7 ratio
Cholesterol: 177 mg/dL (ref 0–200)
HDL: 31 mg/dL — ABNORMAL LOW (ref >40)
LDL Cholesterol: 126 mg/dL — ABNORMAL HIGH (ref 0–99)
Triglycerides: 98 mg/dL (ref <150)
VLDL: 20 mg/dL (ref 0–40)

## 2016-05-15 LAB — COMPREHENSIVE METABOLIC PANEL
ALT: 16 U/L (ref 14–54)
AST: 22 U/L (ref 15–41)
Albumin: 3.8 g/dL (ref 3.5–5.0)
Alkaline Phosphatase: 83 U/L (ref 38–126)
Anion gap: 9 (ref 5–15)
BUN: 9 mg/dL (ref 6–20)
CO2: 26 mmol/L (ref 22–32)
Calcium: 9.5 mg/dL (ref 8.9–10.3)
Chloride: 100 mmol/L — ABNORMAL LOW (ref 101–111)
Creatinine, Ser: 0.86 mg/dL (ref 0.44–1.00)
GFR calc Af Amer: >60 mL/min (ref >60)
GFR calc non Af Amer: >60 mL/min (ref >60)
Glucose, Bld: 131 mg/dL — ABNORMAL HIGH (ref 65–99)
Potassium: 3.6 mmol/L (ref 3.5–5.1)
Sodium: 135 mmol/L (ref 135–145)
Total Bilirubin: 1 mg/dL (ref 0.3–1.2)
Total Protein: 7.2 g/dL (ref 6.5–8.1)

## 2016-05-15 LAB — CBC
HCT: 40.9 % (ref 36.0–46.0)
HEMOGLOBIN: 13.3 g/dL (ref 12.0–15.0)
MCH: 25.3 pg — AB (ref 26.0–34.0)
MCHC: 32.5 g/dL (ref 30.0–36.0)
MCV: 77.9 fL — AB (ref 78.0–100.0)
PLATELETS: 177 10*3/uL (ref 150–400)
RBC: 5.25 MIL/uL — AB (ref 3.87–5.11)
RDW: 14.7 % (ref 11.5–15.5)
WBC: 5.2 10*3/uL (ref 4.0–10.5)

## 2016-05-15 LAB — ECHOCARDIOGRAM COMPLETE
Height: 61.5 in
Weight: 2544 oz

## 2016-05-15 LAB — TROPONIN I: TROPONIN I: 0.39 ng/mL — AB (ref <0.03)

## 2016-05-15 LAB — HEPARIN LEVEL (UNFRACTIONATED): HEPARIN UNFRACTIONATED: 0.68 [IU]/mL (ref 0.30–0.70)

## 2016-05-15 MED ORDER — ENSURE ENLIVE PO LIQD
237.0000 mL | Freq: Two times a day (BID) | ORAL | Status: DC
  Administered 2016-05-16 – 2016-05-18 (×4): 237 mL via ORAL
  Filled 2016-05-15 (×5): qty 237

## 2016-05-15 NOTE — Progress Notes (Signed)
Cardiologist: Dr Johnsie Cancel 2008 Subjective:   Overall doing well overnight. No significant chest pain or shortness of breath.  She was admitted with sharp and burning 7 out of 10 chest pain with shortness of breath and nausea as well as diaphoresis hurting worse with cough, yellow sputum, smoker. Felt better with nitroglycerin. Heparin drip.  Objective:  Vital Signs in the last 24 hours: Temp:  [98.2 F (36.8 C)-99.2 F (37.3 C)] 98.2 F (36.8 C) (09/16 0418) Pulse Rate:  [57-93] 62 (09/16 0418) Resp:  [13-23] 19 (09/16 0418) BP: (111-162)/(62-93) 111/64 (09/16 0418) SpO2:  [91 %-97 %] 95 % (09/16 0731) Weight:  [157 lb 9.6 oz (71.5 kg)-159 lb (72.1 kg)] 159 lb (72.1 kg) (09/16 0418)  Intake/Output from previous day: 09/15 0701 - 09/16 0700 In: -  Out: 350 [Urine:350]   Physical Exam: General: Well developed, well nourished, in no acute distress. Head:  Normocephalic and atraumatic. Lungs: Clear to auscultation and percussion. Heart: Normal S1 and S2.  No murmur, rubs or gallops.  Abdomen: soft, non-tender, positive bowel sounds. Extremities: No clubbing or cyanosis. No edema. Neurologic: Alert and oriented x 3.    Lab Results:  Recent Labs  05/14/16 1250 05/15/16 0330  WBC 4.1 5.2  HGB 14.8 13.3  PLT 195 177    Recent Labs  05/14/16 1335 05/15/16 0330  NA 136 135  K 4.2 3.6  CL 101 100*  CO2 26 26  GLUCOSE 98 131*  BUN 9 9  CREATININE 0.80 0.86    Recent Labs  05/14/16 2131 05/15/16 0330  TROPONINI 0.45* 0.39*   Hepatic Function Panel  Recent Labs  05/15/16 0330  PROT 7.2  ALBUMIN 3.8  AST 22  ALT 16  ALKPHOS 83  BILITOT 1.0    Recent Labs  05/15/16 0330  CHOL 177   No results for input(s): PROTIME in the last 72 hours.  Imaging: Dg Chest 2 View  Result Date: 05/14/2016 CLINICAL DATA:  Chest pain and cough for 2 weeks. EXAM: CHEST  2 VIEW COMPARISON:  09/28/2011 FINDINGS: The cardiac silhouette, mediastinal and hilar contours  are within normal limits stable. There is moderate tortuosity of the thoracic aorta. Streaky bibasilar atelectasis but no infiltrates, edema or effusions. The bony thorax is intact. IMPRESSION: Streaky bibasilar atelectasis but no infiltrates, edema or effusions. Electronically Signed   By: Marijo Sanes M.D.   On: 05/14/2016 13:06   Personally viewed.   Telemetry: No adverse arrhythmias Personally viewed.   EKG:  T-wave inversion inferior and lateral leads consistent with ischemia, sinus rhythm-both EKGs reviewed personally, second EKG shows worsening T-wave inversion Personally viewed.  Cardiac Studies:    Meds: Scheduled Meds: . atorvastatin  40 mg Oral QHS  . guaiFENesin  1,200 mg Oral BID  . ipratropium-albuterol  3 mL Nebulization Q6H  . irbesartan  37.5 mg Oral Daily  . nitroGLYCERIN  1 inch Topical Q6H  . potassium chloride SA  40 mEq Oral Daily  . sodium chloride flush  3 mL Intravenous Q12H  . verapamil  360 mg Oral Daily   Continuous Infusions: . heparin 800 Units/hr (05/14/16 1412)   PRN Meds:.sodium chloride, acetaminophen, ALPRAZolam, chlorpheniramine-HYDROcodone, nitroGLYCERIN, ondansetron (ZOFRAN) IV, sodium chloride flush, zolpidem  Assessment/Plan:  Principal Problem:   SOB (shortness of breath) Active Problems:   Precordial chest pain   Elevated troponin   Abnormal ECG   Chest pain, moderate coronary artery risk  Non-ST elevation myocardial infarction  - Mildly elevated troponin but in  the setting of chest pain, shortness of breath, exertion and fatigue. Troponin 0.46  - Plan for cardiac catheterization on Monday 05/17/16.  - we will place orders for cardiac catheterization.  - Nitroglycerin paste only as needed for chest pain. Still has a pleuritic component to her chest discomfort associated with coughing. Agree somewhat atypical discomfort.  - Aspirin, heparin IV, statin, echocardiogram pending, no beta blocker has been utilized because of respiratory  issue  - We discussed risks and benefits of cardiac catheterization including stroke, heart attack, death, renal impairment. She is willing to proceed.  Hyperlipidemia  - Continue with atorvastatin high intensity dose 40 mg  - LDL 126  Essential hypertension  - Currently well controlled.  Tobacco use  - Encourage cessation  Candee Furbish 05/15/2016, 7:57 AM

## 2016-05-15 NOTE — Progress Notes (Signed)
Patient admitted for chest pain, SOB on exertion and fatigue. Troponin I positive with relatively flat trend CK, MB positive with negative CK total x 1 only. No report of chest pain overnight; 1" nitroglycerin paste in place per orders. Significant reduction in SBP overnight, likely nitrate-related. Will hold AM dose of NTG paste for RN on day shift to follow-up with MD on rounds.  IV heparin infusing without issues or signs of bleeding.  Vital signs: Vitals:   05/14/16 1800 05/14/16 2025 05/14/16 2029 05/15/16 0418  BP: (!) 158/75 131/63  111/64  Pulse: 71 73  62  Resp: 16 19  19   Temp: 98.9 F (37.2 C) 99.2 F (37.3 C)  98.2 F (36.8 C)  TempSrc: Oral Oral  Oral  SpO2: 93% 95% 96% 95%  Weight: 71.5 kg (157 lb 9.6 oz)   72.1 kg (159 lb)  Height: 5' 1.5" (1.562 m)       Has maintained normal sinus rhythm overnight on telemetry; frequent PACs observed.  Plan is for cardiac catheterization on Monday; orders have not yet been entered per MD.  Patient has limited knowledge of health problems, but is ready to learn. Educated earlier this evening regarding tests, procedures, plan of care and medications.  Will need ongoing, focused education to ensure medication safety and adherence post-discharge.  Social work consultation ordered per protocol for strained living situation. Case management consult ordered per protocol for potential home health needs.  Continuing to monitor.

## 2016-05-15 NOTE — Progress Notes (Signed)
*  PRELIMINARY RESULTS* Echocardiogram 2D Echocardiogram has been performed.  [name] 05/15/2016, 11:17 AM

## 2016-05-15 NOTE — Progress Notes (Signed)
[facility] for heparin Indication: chest pain/ACS  Allergies  Allergen Reactions  . Aspirin Shortness Of Breath and Swelling    Patient Measurements: Height: 5' 1.5" (156.2 cm) Weight: 159 lb (72.1 kg) IBW/kg (Calculated) : 48.95 Heparin Dosing Weight: ~64kg  Vital Signs: Temp: 98.2 F (36.8 C) (09/16 0418) Temp Source: Oral (09/16 0418) BP: 131/66 (09/16 0837) Pulse Rate: 62 (09/16 0418)  Labs:  Recent Labs  05/14/16 1250 05/14/16 1335 05/14/16 1900 05/14/16 2131 05/15/16 0330 05/15/16 0844  HGB 14.8  --   --   --  13.3  --   HCT 45.2  --   --   --  40.9  --   PLT 195  --   --   --  177  --   HEPARINUNFRC  --   --   --  0.61  --  0.68  CREATININE  --  0.80  --   --  0.86  --   CKTOTAL  --   --  241*  --   --   --   CKMB  --   --  3.4  --   --   --   TROPONINI  --   --  0.46* 0.45* 0.39*  --     Estimated Creatinine Clearance: 55.9 mL/min (by C-G formula based on SCr of 0.86 mg/dL).  Assessment: 53 yof with CP on admit. Pharmacy consulted to dose heparin for ACS. Not on anticoagulation PTA.  -HL 0.68 on 9/16 AM -Hgb 13.3, Plts 177  -No bleed noted  Goal of Therapy:  Heparin level 0.3-0.7 units/ml Monitor platelets by anticoagulation protocol: Yes   Plan:  Continue heparin at 800 units / hr Daily heparin level/CBC Mon s/sx bleeding  Myer Peer Grayland Ormond), PharmD  PGY1 Pharmacy Resident Pager: [phone] 05/15/2016 10:45 AM

## 2016-05-16 LAB — CBC
HCT: 41 % (ref 36.0–46.0)
Hemoglobin: 13.1 g/dL (ref 12.0–15.0)
MCH: 25 pg — ABNORMAL LOW (ref 26.0–34.0)
MCHC: 32 g/dL (ref 30.0–36.0)
MCV: 78.1 fL (ref 78.0–100.0)
PLATELETS: 204 10*3/uL (ref 150–400)
RBC: 5.25 MIL/uL — ABNORMAL HIGH (ref 3.87–5.11)
RDW: 14.7 % (ref 11.5–15.5)
WBC: 5 10*3/uL (ref 4.0–10.5)

## 2016-05-16 LAB — PROTIME-INR
INR: 1.08
PROTHROMBIN TIME: 14 s (ref 11.4–15.2)

## 2016-05-16 LAB — HEPARIN LEVEL (UNFRACTIONATED): HEPARIN UNFRACTIONATED: 0.66 [IU]/mL (ref 0.30–0.70)

## 2016-05-16 MED ORDER — SODIUM CHLORIDE 0.9% FLUSH: 3.0000 mL | Freq: Two times a day (BID) | INTRAVENOUS | Status: DC

## 2016-05-16 MED ORDER — SODIUM CHLORIDE 0.9 % WEIGHT BASED INFUSION
1.0000 mL/kg/h | INTRAVENOUS | Status: DC
  Administered 2016-05-16: 1 mL/kg/h via INTRAVENOUS

## 2016-05-16 MED ORDER — SODIUM CHLORIDE 0.9% FLUSH: 3.0000 mL | INTRAVENOUS | Status: DC | PRN

## 2016-05-16 MED ORDER — IPRATROPIUM-ALBUTEROL 0.5-2.5 (3) MG/3ML IN SOLN
3.0000 mL | Freq: Three times a day (TID) | RESPIRATORY_TRACT | Status: DC
  Administered 2016-05-16 – 2016-05-18 (×5): 3 mL via RESPIRATORY_TRACT
  Filled 2016-05-16 (×6): qty 3

## 2016-05-16 MED ORDER — SODIUM CHLORIDE 0.9 % IV SOLN: 250.0000 mL | INTRAVENOUS | Status: DC | PRN

## 2016-05-16 NOTE — Progress Notes (Signed)
[facility] for heparin Indication: chest pain/ACS  Allergies  Allergen Reactions  . Aspirin Shortness Of Breath and Swelling    Patient Measurements: Height: 5' 1.5" (156.2 cm) Weight: 162 lb (73.5 kg) IBW/kg (Calculated) : 48.95 Heparin Dosing Weight: ~64kg  Vital Signs: Temp: 98.6 F (37 C) (09/17 0441) Temp Source: Oral (09/17 0441) BP: 113/47 (09/17 0441) Pulse Rate: 81 (09/17 0441)  Labs:  Recent Labs  05/14/16 1250 05/14/16 1335 05/14/16 1900 05/14/16 2131 05/15/16 0330 05/15/16 0844 05/16/16 0452  HGB 14.8  --   --   --  13.3  --  13.1  HCT 45.2  --   --   --  40.9  --  41.0  PLT 195  --   --   --  177  --  204  HEPARINUNFRC  --   --   --  0.61  --  0.68 0.66  CREATININE  --  0.80  --   --  0.86  --   --   CKTOTAL  --   --  241*  --   --   --   --   CKMB  --   --  3.4  --   --   --   --   TROPONINI  --   --  0.46* 0.45* 0.39*  --   --     Estimated Creatinine Clearance: 56.5 mL/min (by C-G formula based on SCr of 0.86 mg/dL).  Assessment: 13 yof with CP on admit. Pharmacy consulted to dose heparin for ACS. Not on anticoagulation PTA. Plans for cath on 9/18.  -HL 0.66 this AM -CBC stable and WNL   -No bleed noted  Goal of Therapy:  Heparin level 0.3-0.7 units/ml Monitor platelets by anticoagulation protocol: Yes   Plan:  Continue heparin at 800 units / hr Daily heparin level/CBC Mon s/sx bleeding  Myer Peer Grayland Ormond), PharmD  PGY1 Pharmacy Resident Pager: [phone] 05/16/2016 8:09 AM

## 2016-05-16 NOTE — Progress Notes (Signed)
Cardiologist: Dr Johnsie Cancel 2008 Subjective:   Overall doing well overnight. No significant chest pain or shortness of breath. Mild back pain from bed she states. Mild cough  She was admitted with sharp and burning 7 out of 10 chest pain with shortness of breath and nausea as well as diaphoresis hurting worse with cough, yellow sputum, smoker. Felt better with nitroglycerin. Heparin drip.  Objective:  Vital Signs in the last 24 hours: Temp:  [98.3 F (36.8 C)-98.6 F (37 C)] 98.6 F (37 C) (09/17 0441) Pulse Rate:  [58-81] 81 (09/17 0441) Resp:  [15-22] 20 (09/17 0441) BP: (113-154)/(47-75) 113/47 (09/17 0441) SpO2:  [93 %-100 %] 100 % (09/17 0441) Weight:  [162 lb (73.5 kg)] 162 lb (73.5 kg) (09/17 0441)  Intake/Output from previous day: 09/16 0701 - 09/17 0700 In: 1603.4 [P.O.:1260; I.V.:343.4] Out: 1000 [Urine:1000]   Physical Exam: General: Well developed, well nourished, in no acute distress. Head:  Normocephalic and atraumatic. Lungs: Clear to auscultation and percussion. Heart: Normal S1 and S2.  1/6 SEM RUSB,no rubs or gallops.  Abdomen: soft, non-tender, positive bowel sounds. Extremities: No clubbing or cyanosis. No edema. Neurologic: Alert and oriented x 3.    Lab Results:  Recent Labs  05/15/16 0330 05/16/16 0452  WBC 5.2 5.0  HGB 13.3 13.1  PLT 177 204    Recent Labs  05/14/16 1335 05/15/16 0330  NA 136 135  K 4.2 3.6  CL 101 100*  CO2 26 26  GLUCOSE 98 131*  BUN 9 9  CREATININE 0.80 0.86    Recent Labs  05/14/16 2131 05/15/16 0330  TROPONINI 0.45* 0.39*   Hepatic Function Panel  Recent Labs  05/15/16 0330  PROT 7.2  ALBUMIN 3.8  AST 22  ALT 16  ALKPHOS 83  BILITOT 1.0    Recent Labs  05/15/16 0330  CHOL 177   No results for input(s): PROTIME in the last 72 hours.  Imaging: Dg Chest 2 View  Result Date: 05/14/2016 CLINICAL DATA:  Chest pain and cough for 2 weeks. EXAM: CHEST  2 VIEW COMPARISON:  09/28/2011 FINDINGS:  The cardiac silhouette, mediastinal and hilar contours are within normal limits stable. There is moderate tortuosity of the thoracic aorta. Streaky bibasilar atelectasis but no infiltrates, edema or effusions. The bony thorax is intact. IMPRESSION: Streaky bibasilar atelectasis but no infiltrates, edema or effusions. Electronically Signed   By: Marijo Sanes M.D.   On: 05/14/2016 13:06   Personally viewed.   Telemetry: No adverse arrhythmias Personally viewed.   EKG:  T-wave inversion inferior and lateral leads consistent with ischemia, sinus rhythm-both EKGs reviewed personally, second EKG shows worsening T-wave inversion Personally viewed.  Cardiac Studies:    Echo: 05/15/16 - Left ventricle: The cavity size was normal. There was moderate   concentric hypertrophy. Systolic function was normal. The   estimated ejection fraction was in the range of 60% to 65%. Wall   motion was normal; there were no regional wall motion   abnormalities. Doppler parameters are consistent with abnormal   left ventricular relaxation (grade 1 diastolic dysfunction).   There was no evidence of elevated ventricular filling pressure by   Doppler parameters. - Aortic valve: Trileaflet; mildly thickened, moderately calcified   leaflets. There was mild to moderate stenosis. There was no   regurgitation. Mean gradient (S): 8 mm Hg. Peak gradient (S): 18   mm Hg. Valve area (VTI): 1.73 cm^2. Valve area (Vmax): 1.57 cm^2.   Valve area (Vmean): 1.65 cm^2. -  Right ventricle: The cavity size was normal. Wall thickness was   normal. Systolic function was normal. - Right atrium: The atrium was normal in size. - Tricuspid valve: There was trivial regurgitation. - Pulmonic valve: There was no regurgitation. - Pulmonary arteries: Systolic pressure was within the normal   range. - Inferior vena cava: The vessel was normal in size. - Pericardium, extracardiac: There was no pericardial effusion.  Meds: Scheduled Meds: .  atorvastatin  40 mg Oral QHS  . feeding supplement (ENSURE ENLIVE)  237 mL Oral BID BM  . guaiFENesin  1,200 mg Oral BID  . ipratropium-albuterol  3 mL Nebulization Q6H  . irbesartan  37.5 mg Oral Daily  . potassium chloride SA  40 mEq Oral Daily  . sodium chloride flush  3 mL Intravenous Q12H  . verapamil  360 mg Oral Daily   Continuous Infusions: . heparin 800 Units/hr (05/15/16 1252)   PRN Meds:.sodium chloride, acetaminophen, ALPRAZolam, chlorpheniramine-HYDROcodone, nitroGLYCERIN, ondansetron (ZOFRAN) IV, sodium chloride flush, zolpidem  Assessment/Plan:  Principal Problem:   SOB (shortness of breath) Active Problems:   Precordial chest pain   Elevated troponin   Abnormal ECG   Chest pain, moderate coronary artery risk  Non-ST elevation myocardial infarction  - Mildly elevated troponin but in the setting of chest pain, shortness of breath, exertion and fatigue. Troponin 0.46  - Plan for cardiac catheterization on Monday 05/17/16.  - orders in for cardiac catheterization.  - Nitroglycerin paste only as needed for chest pain. Still has a pleuritic component to her chest discomfort associated with coughing. Agree somewhat atypical discomfort.  - Aspirin, heparin IV, statin, echocardiogram pending, no beta blocker has been utilized because of respiratory issue  - We discussed risks and benefits of cardiac catheterization including stroke, heart attack, death, renal impairment. She is willing to proceed.  Hyperlipidemia  - Continue with atorvastatin high intensity dose 40 mg  - LDL 126  Essential hypertension  - Currently well controlled.  Tobacco use  - Encourage cessation  Aortic stenosis  - Mild to moderate on echocardiogram. Murmur appreciated.  Candee Furbish 05/16/2016, 8:13 AM

## 2016-05-17 ENCOUNTER — Encounter (HOSPITAL_COMMUNITY)
Admission: EM | Disposition: A | Discharge status: Home / Self Care | Payer: Self-pay | Source: Home / Self Care | Attending: Cardiovascular Disease

## 2016-05-17 ENCOUNTER — Encounter (HOSPITAL_COMMUNITY): Payer: Self-pay | Admitting: Interventional Cardiology

## 2016-05-17 DIAGNOSIS — E785 Hyperlipidemia, unspecified: Secondary | ICD-10-CM

## 2016-05-17 DIAGNOSIS — I350 Nonrheumatic aortic (valve) stenosis: Secondary | ICD-10-CM

## 2016-05-17 DIAGNOSIS — I25118 Atherosclerotic heart disease of native coronary artery with other forms of angina pectoris: Secondary | ICD-10-CM

## 2016-05-17 HISTORY — PX: CARDIAC CATHETERIZATION: SHX172

## 2016-05-17 LAB — CREATININE, SERUM
CREATININE: 0.78 mg/dL (ref 0.44–1.00)
GFR calc Af Amer: >60 mL/min (ref >60)

## 2016-05-17 LAB — POCT ACTIVATED CLOTTING TIME
ACTIVATED CLOTTING TIME: 356 s
Activated Clotting Time: 268 seconds
Activated Clotting Time: 346 seconds

## 2016-05-17 LAB — CBC
HCT: 37.3 % (ref 36.0–46.0)
HCT: 37.6 % (ref 36.0–46.0)
Hemoglobin: 11.9 g/dL — ABNORMAL LOW (ref 12.0–15.0)
Hemoglobin: 12 g/dL (ref 12.0–15.0)
MCH: 25.1 pg — ABNORMAL LOW (ref 26.0–34.0)
MCH: 25 pg — AB (ref 26.0–34.0)
MCHC: 31.9 g/dL (ref 30.0–36.0)
MCHC: 31.9 g/dL (ref 30.0–36.0)
MCV: 78.7 fL (ref 78.0–100.0)
MCV: 78.3 fL (ref 78.0–100.0)
PLATELETS: 196 10*3/uL (ref 150–400)
PLATELETS: 199 10*3/uL (ref 150–400)
RBC: 4.74 MIL/uL (ref 3.87–5.11)
RBC: 4.8 MIL/uL (ref 3.87–5.11)
RDW: 15 % (ref 11.5–15.5)
RDW: 15.4 % (ref 11.5–15.5)
WBC: 5.2 10*3/uL (ref 4.0–10.5)
WBC: 4.8 10*3/uL (ref 4.0–10.5)

## 2016-05-17 LAB — HEPARIN LEVEL (UNFRACTIONATED): HEPARIN UNFRACTIONATED: 0.68 [IU]/mL (ref 0.30–0.70)

## 2016-05-17 SURGERY — LEFT HEART CATH AND CORONARY ANGIOGRAPHY
Anesthesia: LOCAL

## 2016-05-17 MED ORDER — FENTANYL CITRATE (PF) 100 MCG/2ML IJ SOLN
INTRAMUSCULAR | Status: DC | PRN
  Administered 2016-05-17: 25 ug via INTRAVENOUS
  Administered 2016-05-17: 50 ug via INTRAVENOUS

## 2016-05-17 MED ORDER — HEPARIN SODIUM (PORCINE) 1000 UNIT/ML IJ SOLN
INTRAMUSCULAR | Status: AC
  Filled 2016-05-17: qty 1

## 2016-05-17 MED ORDER — NITROGLYCERIN 1 MG/10 ML FOR IR/CATH LAB
INTRA_ARTERIAL | Status: AC
  Filled 2016-05-17: qty 10

## 2016-05-17 MED ORDER — FENTANYL CITRATE (PF) 100 MCG/2ML IJ SOLN
INTRAMUSCULAR | Status: AC
  Filled 2016-05-17: qty 2

## 2016-05-17 MED ORDER — IOPAMIDOL (ISOVUE-370) INJECTION 76%
INTRAVENOUS | Status: AC
  Filled 2016-05-17: qty 100

## 2016-05-17 MED ORDER — ANGIOPLASTY BOOK
Freq: Once | Status: AC
  Administered 2016-05-17: 14:00:00
  Filled 2016-05-17: qty 1

## 2016-05-17 MED ORDER — SODIUM CHLORIDE 0.9% FLUSH: 3.0000 mL | Freq: Two times a day (BID) | INTRAVENOUS | Status: DC

## 2016-05-17 MED ORDER — VERAPAMIL HCL 2.5 MG/ML IV SOLN
INTRAVENOUS | Status: DC | PRN
  Administered 2016-05-17: 10 mL via INTRA_ARTERIAL

## 2016-05-17 MED ORDER — IOPAMIDOL (ISOVUE-370) INJECTION 76%
INTRAVENOUS | Status: AC
Start: 2016-05-17 — End: 2016-05-17
  Filled 2016-05-17: qty 50

## 2016-05-17 MED ORDER — MORPHINE SULFATE (PF) 2 MG/ML IV SOLN: 2.0000 mg | INTRAVENOUS | Status: DC | PRN

## 2016-05-17 MED ORDER — AMLODIPINE BESYLATE 5 MG PO TABS
5.0000 mg | ORAL_TABLET | Freq: Every day | ORAL | Status: DC
  Administered 2016-05-17 – 2016-05-18 (×2): 5 mg via ORAL
  Filled 2016-05-17 (×2): qty 1

## 2016-05-17 MED ORDER — HEPARIN SODIUM (PORCINE) 1000 UNIT/ML IJ SOLN
INTRAMUSCULAR | Status: DC | PRN
  Administered 2016-05-17: 2500 [IU] via INTRAVENOUS
  Administered 2016-05-17: 3500 [IU] via INTRAVENOUS
  Administered 2016-05-17: 5000 [IU] via INTRAVENOUS

## 2016-05-17 MED ORDER — HEART ATTACK BOUNCING BOOK
Freq: Once | Status: AC
  Administered 2016-05-17: 14:00:00
  Filled 2016-05-17: qty 1

## 2016-05-17 MED ORDER — TIROFIBAN (AGGRASTAT) BOLUS VIA INFUSION
INTRAVENOUS | Status: DC | PRN
  Administered 2016-05-17: 1870 ug via INTRAVENOUS

## 2016-05-17 MED ORDER — IOPAMIDOL (ISOVUE-370) INJECTION 76%
INTRAVENOUS | Status: DC | PRN
  Administered 2016-05-17: 345 mL via INTRA_ARTERIAL

## 2016-05-17 MED ORDER — LIDOCAINE HCL (PF) 1 % IJ SOLN
INTRAMUSCULAR | Status: AC
  Filled 2016-05-17: qty 30

## 2016-05-17 MED ORDER — TICAGRELOR 90 MG PO TABS
90.0000 mg | ORAL_TABLET | Freq: Two times a day (BID) | ORAL | Status: DC
  Administered 2016-05-18 (×2): 90 mg via ORAL
  Filled 2016-05-17 (×2): qty 1

## 2016-05-17 MED ORDER — TICAGRELOR 90 MG PO TABS
ORAL_TABLET | ORAL | Status: DC | PRN
  Administered 2016-05-17: 180 mg via ORAL

## 2016-05-17 MED ORDER — ENOXAPARIN SODIUM 40 MG/0.4ML ~~LOC~~ SOLN
40.0000 mg | SUBCUTANEOUS | Status: DC
  Administered 2016-05-18: 40 mg via SUBCUTANEOUS
  Filled 2016-05-17: qty 0.4

## 2016-05-17 MED ORDER — SODIUM CHLORIDE 0.9 % IV SOLN: 250.0000 mL | INTRAVENOUS | Status: DC | PRN

## 2016-05-17 MED ORDER — ONDANSETRON HCL 4 MG/2ML IJ SOLN
4.0000 mg | Freq: Four times a day (QID) | INTRAMUSCULAR | Status: DC | PRN
  Administered 2016-05-17: 4 mg via INTRAVENOUS
  Filled 2016-05-17: qty 2

## 2016-05-17 MED ORDER — OXYCODONE-ACETAMINOPHEN 5-325 MG PO TABS: 1.0000 | ORAL_TABLET | ORAL | Status: DC | PRN

## 2016-05-17 MED ORDER — LIDOCAINE HCL (PF) 1 % IJ SOLN
INTRAMUSCULAR | Status: DC | PRN
  Administered 2016-05-17: 2 mL

## 2016-05-17 MED ORDER — MIDAZOLAM HCL 2 MG/2ML IJ SOLN
INTRAMUSCULAR | Status: AC
  Filled 2016-05-17: qty 2

## 2016-05-17 MED ORDER — TIROFIBAN HCL IN NACL 5-0.9 MG/100ML-% IV SOLN
INTRAVENOUS | Status: AC
  Filled 2016-05-17: qty 100

## 2016-05-17 MED ORDER — TIROFIBAN (AGGRASTAT) BOLUS VIA INFUSION
25.0000 ug/kg | Freq: Once | INTRAVENOUS | Status: DC
  Filled 2016-05-17: qty 38

## 2016-05-17 MED ORDER — MIDAZOLAM HCL 2 MG/2ML IJ SOLN
INTRAMUSCULAR | Status: DC | PRN
  Administered 2016-05-17 (×2): 1 mg via INTRAVENOUS

## 2016-05-17 MED ORDER — ATORVASTATIN CALCIUM 80 MG PO TABS
80.0000 mg | ORAL_TABLET | Freq: Every day | ORAL | Status: DC
  Administered 2016-05-17: 80 mg via ORAL
  Filled 2016-05-17: qty 1

## 2016-05-17 MED ORDER — TICAGRELOR 90 MG PO TABS
ORAL_TABLET | ORAL | Status: AC
  Filled 2016-05-17: qty 2

## 2016-05-17 MED ORDER — VERAPAMIL HCL 2.5 MG/ML IV SOLN
INTRAVENOUS | Status: AC
  Filled 2016-05-17: qty 2

## 2016-05-17 MED ORDER — SODIUM CHLORIDE 0.9% FLUSH
3.0000 mL | INTRAVENOUS | Status: DC | PRN
Start: 2016-05-17 — End: 2016-05-18

## 2016-05-17 MED ORDER — SODIUM CHLORIDE 0.9 % WEIGHT BASED INFUSION: 1.0000 mL/kg/h | INTRAVENOUS | Status: DC

## 2016-05-17 MED ORDER — HEPARIN (PORCINE) IN NACL 2-0.9 UNIT/ML-% IJ SOLN
INTRAMUSCULAR | Status: DC | PRN
  Administered 2016-05-17: 1000 mL

## 2016-05-17 MED ORDER — NITROGLYCERIN 1 MG/10 ML FOR IR/CATH LAB
INTRA_ARTERIAL | Status: DC | PRN
  Administered 2016-05-17 (×2): 200 ug via INTRACORONARY

## 2016-05-17 MED ORDER — HEPARIN (PORCINE) IN NACL 2-0.9 UNIT/ML-% IJ SOLN
INTRAMUSCULAR | Status: AC
  Filled 2016-05-17: qty 1000

## 2016-05-17 MED ORDER — ACETAMINOPHEN 325 MG PO TABS: 650.0000 mg | ORAL_TABLET | ORAL | Status: DC | PRN

## 2016-05-17 SURGICAL SUPPLY — 22 items
BALLN EMERGE MR 2.0X12 (BALLOONS) ×2
BALLN ~~LOC~~ EUPHORA RX 2.75X15 (BALLOONS) ×2
BALLOON EMERGE MR 2.0X12 (BALLOONS) ×1 IMPLANT
BALLOON ~~LOC~~ EUPHORA RX 2.75X15 (BALLOONS) IMPLANT
CATH INFINITI 5 FR JL3.5 (CATHETERS) ×2 IMPLANT
CATH INFINITI JR4 5F (CATHETERS) ×1 IMPLANT
CATH VISTA GUIDE 6FR AR2 (CATHETERS) ×1 IMPLANT
CATH VISTA GUIDE 6FR XBRCA (CATHETERS) ×1 IMPLANT
DEVICE RAD COMP TR BAND LRG (VASCULAR PRODUCTS) ×1 IMPLANT
GLIDESHEATH SLEND A-KIT 6F 22G (SHEATH) ×1 IMPLANT
KIT ENCORE 26 ADVANTAGE (KITS) ×1 IMPLANT
KIT HEART LEFT (KITS) ×2 IMPLANT
PACK CARDIAC CATHETERIZATION (CUSTOM PROCEDURE TRAY) ×2 IMPLANT
STENT PROMUS PREM MR 2.25X20 (Permanent Stent) ×1 IMPLANT
STENT PROMUS PREM MR 2.5X20 (Permanent Stent) ×2 IMPLANT
TRANSDUCER W/STOPCOCK (MISCELLANEOUS) ×2 IMPLANT
TUBING CIL FLEX 10 FLL-RA (TUBING) ×2 IMPLANT
WIRE ASAHI PROWATER 180CM (WIRE) ×1 IMPLANT
WIRE HI TORQ BMW 190CM (WIRE) ×1 IMPLANT
WIRE HI TORQ VERSACORE-J 145CM (WIRE) ×1 IMPLANT
WIRE HI TORQ WHISPER MS 190CM (WIRE) ×1 IMPLANT
WIRE SAFE-T 1.5MM-J .035X260CM (WIRE) ×2 IMPLANT

## 2016-05-17 NOTE — CV Procedure (Signed)
   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  Tortuous moderate to moderately severe proximal to mid circumflex obstruction. Dominant widely patent LAD with 85-90% obstruction in the proximal segment of the first diagonal.  The right coronary with a 95% mid stenosis and 90-95% distal stenosis was felt the most likely culprit for the patient's presentation.  After discussing with the patient we proceeded with ad hoc PTCA of multilesion disease in the RCA. This was a technically very difficult procedure because of poor guide support. The procedure was prolonged. 2 moderately and stents were placed in the right coronary in the mid position and the distal position. Final result was very good. The patient did experience chest discomfort that is compatible with her index complaint for admission.  The left coronary lesion in the circumflex would be highly risky because of tortuosity and difficulty with wiring. I would recommend medical therapy for residual coronary disease and if symptoms are incompatible with the patient's desired quality of life, high risk circumflex PCI and diagonal PCI could be considered.

## 2016-05-17 NOTE — Progress Notes (Signed)
[place] for heparin Indication: chest pain/ACS  Allergies  Allergen Reactions  . Aspirin Shortness Of Breath and Swelling    Patient Measurements: Height: 5' 1.5" (156.2 cm) Weight: 164 lb 14.4 oz (74.8 kg) IBW/kg (Calculated) : 48.95 Heparin Dosing Weight: ~64kg  Vital Signs: Temp: 98.5 F (36.9 C) (09/18 0813) Temp Source: Oral (09/18 0813) BP: 130/63 (09/18 0813) Pulse Rate: 64 (09/18 0813)  Labs:  Recent Labs  05/14/16 1335 05/14/16 1900  05/14/16 2131 05/15/16 0330 05/15/16 0844 05/16/16 0452 05/16/16 0856 05/17/16 0523  HGB  --   --   --   --  13.3  --  13.1  --  11.9*  HCT  --   --   --   --  40.9  --  41.0  --  37.3  PLT  --   --   --   --  177  --  204  --  196  LABPROT  --   --   --   --   --   --   --  14.0  --   INR  --   --   --   --   --   --   --  1.08  --   HEPARINUNFRC  --   --   < > 0.61  --  0.68 0.66  --  0.68  CREATININE 0.80  --   --   --  0.86  --   --   --   --   CKTOTAL  --  241*  --   --   --   --   --   --   --   CKMB  --  3.4  --   --   --   --   --   --   --   TROPONINI  --  0.46*  --  0.45* 0.39*  --   --   --   --   < > = values in this interval not displayed.  Estimated Creatinine Clearance: 57 mL/min (by C-G formula based on SCr of 0.86 mg/dL).  Assessment: 3 yof with CP on admit and noted with NSTEMI. Pharmacy consulted to dose heparin. Plans for cath today.  -Heparin level= 0.66  -hg= 13.1>>11.9  Goal of Therapy:  Heparin level 0.3-0.7 units/ml Monitor platelets by anticoagulation protocol: Yes   Plan:  Continue heparin at 800 units / hr Daily heparin level/CBC  Hildred Laser, Pharm D 05/17/2016 8:24 AM

## 2016-05-17 NOTE — Progress Notes (Signed)
Patient coughing and has thick yellow/clear sputum noted. Oral suction set up at bedside and patient medicated with prn cough medicine. Patient also states she has some nausea with the coughing so medicated with Zofran prior to given cough medicine and respiratory called to given patient a breathing treatment. Respiratory therapist is at bedside at this time. No s/s of distress noted or complaints voiced at this time. Call bell is in reach and bed is in lowest position.

## 2016-05-17 NOTE — Progress Notes (Signed)
TR BAND REMOVAL  LOCATION:    right radial  DEFLATED PER PROTOCOL:    Yes.    TIME BAND OFF / DRESSING APPLIED:    1630   SITE UPON ARRIVAL:    Level 0  SITE AFTER BAND REMOVAL:    Level 0  CIRCULATION SENSATION AND MOVEMENT:    Within Normal Limits   Yes.    COMMENTS:   Patient tolerated well. Patient did complain of some shortness of breath, but oxygen saturations remains 100%. Patient has had Brilinta and a breathing treatment. Given a coke to drink and applied 2L nasal cannula supplement oxygen and 5 minutes later patient states "I feel much better, no more shortness of breath." Post band removal instructions given. Teach back effective.

## 2016-05-17 NOTE — Care Management Important Message (Signed)
Important Message  Patient Details  Name: [name] MRN: [id number] Date of Birth: October 29, 1945   Medicare Important Message Given:  Yes    Nathen May 05/17/2016, 9:50 AM

## 2016-05-17 NOTE — Interval H&P Note (Signed)
Cath Lab Visit (complete for each Cath Lab visit)  Clinical Evaluation Leading to the Procedure:   ACS: Yes.    Non-ACS:    Anginal Classification: CCS III  Anti-ischemic medical therapy: Minimal Therapy (1 class of medications)  Non-Invasive Test Results: No non-invasive testing performed  Prior CABG: No previous CABG      History and Physical Interval Note:  05/17/2016 10:48 AM  [name]  has presented today for surgery, with the diagnosis of NSTEMI  The various methods of treatment have been discussed with the patient and family. After consideration of risks, benefits and other options for treatment, the patient has consented to  Procedure(s): Left Heart Cath and Coronary Angiography (N/A) as a surgical intervention .  The patient's history has been reviewed, patient examined, no change in status, stable for surgery.  I have reviewed the patient's chart and labs.  Questions were answered to the patient's satisfaction.     Belva Crome III

## 2016-05-17 NOTE — Progress Notes (Signed)
Nutrition Brief Note  Patient identified on the Malnutrition Screening Tool (MST) Report. Patient with minimal weight loss of 2% over the past 2-3 months, which is not significant for the time frame.  Wt Readings from Last 15 Encounters:  05/17/16 164 lb 14.4 oz (74.8 kg)  05/10/16 162 lb 3.2 oz (73.6 kg)  03/11/16 168 lb (76.2 kg)  02/19/16 168 lb 3.2 oz (76.3 kg)  09/08/09 170 lb (77.1 kg)    Body mass index is 30.65 kg/m. Patient meets criteria for class 1 obesity based on current BMI.   Current diet order is NPO for cardiac cath, previously on heart healthy, consuming 100% of meals. Labs and medications reviewed.   No nutrition interventions warranted at this time. If nutrition issues arise, please consult RD.   Molli Barrows, RD, LDN, [place] Pager [phone] After Hours Pager [phone]

## 2016-05-17 NOTE — H&P (View-Only) (Signed)
Patient Profile: 70 y.o. female with a history of HTN, HLD, tobacco abuse, admitted for CP. Ruled in for NSTEMI.   Subjective: Doing well this am. Denies any CP today.   Objective: Vital signs in last 24 hours: Temp:  [98 F (36.7 C)-98.7 F (37.1 C)] 98 F (36.7 C) (09/18 0438) Pulse Rate:  [53-74] 62 (09/18 0438) Resp:  [17-24] 17 (09/18 0438) BP: (120-149)/(52-74) 132/74 (09/18 0438) SpO2:  [94 %-98 %] 98 % (09/18 0438) Weight:  [164 lb 14.4 oz (74.8 kg)] 164 lb 14.4 oz (74.8 kg) (09/18 0438) Last BM Date: 05/14/16  Intake/Output from previous day: 09/17 0701 - 09/18 0700 In: 1520 [P.O.:480; I.V.:1040] Out: 1500 [Urine:1500] Intake/Output this shift: No intake/output data recorded.  Medications Current Facility-Administered Medications  Medication Dose Route Frequency Provider Last Rate Last Dose  . 0.9 %  sodium chloride infusion  250 mL Intravenous PRN Evelene Croon Barrett, PA-C 10 mL/hr at 05/15/16 2354 250 mL at 05/15/16 2354  . 0.9 %  sodium chloride infusion  250 mL Intravenous PRN Bhavinkumar Bhagat, PA      . 0.9% sodium chloride infusion  1 mL/kg/hr Intravenous Continuous Bhavinkumar Bhagat, PA 72.1 mL/hr at 05/16/16 2149 1 mL/kg/hr at 05/16/16 2149  . acetaminophen (TYLENOL) tablet 650 mg  650 mg Oral Q4H PRN Rhonda G Barrett, PA-C      . ALPRAZolam Duanne Moron) tablet 0.25 mg  0.25 mg Oral BID PRN Evelene Croon Barrett, PA-C   0.25 mg at 05/15/16 2130  . atorvastatin (LIPITOR) tablet 40 mg  40 mg Oral QHS Rhonda G Barrett, PA-C   40 mg at 05/16/16 2112  . chlorpheniramine-HYDROcodone (TUSSIONEX) 10-8 MG/5ML suspension 5 mL  5 mL Oral Q12H PRN Evelene Croon Barrett, PA-C   5 mL at 05/16/16 2112  . feeding supplement (ENSURE ENLIVE) (ENSURE ENLIVE) liquid 237 mL  237 mL Oral BID BM Lorretta Harp, MD   237 mL at 05/16/16 1440  . guaiFENesin (MUCINEX) 12 hr tablet 1,200 mg  1,200 mg Oral BID Evelene Croon Barrett, PA-C   1,200 mg at 05/16/16 2112  . heparin ADULT infusion 100  units/mL (25000 units/250mL sodium chloride 0.45%)  800 Units/hr Intravenous Continuous Romona Curls, RPH 8 mL/hr at 05/16/16 2334 800 Units/hr at 05/16/16 2334  . ipratropium-albuterol (DUONEB) 0.5-2.5 (3) MG/3ML nebulizer solution 3 mL  3 mL Nebulization TID Lorretta Harp, MD   3 mL at 05/17/16 0733  . irbesartan (AVAPRO) tablet 37.5 mg  37.5 mg Oral Daily Rhonda G Barrett, PA-C   37.5 mg at 05/16/16 0843  . nitroGLYCERIN (NITROSTAT) SL tablet 0.4 mg  0.4 mg Sublingual Q5 Min x 3 PRN Rhonda G Barrett, PA-C      . ondansetron (ZOFRAN) injection 4 mg  4 mg Intravenous Q6H PRN Rhonda G Barrett, PA-C      . potassium chloride SA (K-DUR,KLOR-CON) CR tablet 40 mEq  40 mEq Oral Daily Lorretta Harp, MD   40 mEq at 05/16/16 0842  . sodium chloride flush (NS) 0.9 % injection 3 mL  3 mL Intravenous Q12H Rhonda G Barrett, PA-C   3 mL at 05/15/16 2105  . sodium chloride flush (NS) 0.9 % injection 3 mL  3 mL Intravenous PRN Rhonda G Barrett, PA-C      . sodium chloride flush (NS) 0.9 % injection 3 mL  3 mL Intravenous Q12H Bhavinkumar Bhagat, PA      . sodium chloride flush (NS) 0.9 % injection 3 mL  3 mL Intravenous PRN Bhavinkumar Bhagat, PA      . verapamil (CALAN-SR) CR tablet 360 mg  360 mg Oral Daily Rhonda G Barrett, PA-C   360 mg at 05/16/16 [id number]  . zolpidem (AMBIEN) tablet 5 mg  5 mg Oral QHS PRN Evelene Croon Barrett, PA-C   5 mg at 05/14/16 2125    PE: General appearance: alert, cooperative and no distress Neck: no carotid bruit and no JVD Lungs: expriatory wheezing bilaterally, anteriorlly Abdomen: soft, non-tender; bowel sounds normal; no masses,  no organomegaly Cardiac: RRR  Extremities: warm and dry Pulses: 2+ and symmetric Skin: warm and dry Neurologic: Grossly normal  Lab Results:   Recent Labs  05/15/16 0330 05/16/16 0452 05/17/16 0523  WBC 5.2 5.0 5.2  HGB 13.3 13.1 11.9*  HCT 40.9 41.0 37.3  PLT 177 204 196   BMET  Recent Labs  05/14/16 1335 05/15/16 0330  NA 136  135  K 4.2 3.6  CL 101 100*  CO2 26 26  GLUCOSE 98 131*  BUN 9 9  CREATININE 0.80 0.86  CALCIUM 10.3 9.5   PT/INR  Recent Labs  05/16/16 0856  LABPROT 14.0  INR 1.08   Cholesterol  Recent Labs  05/15/16 0330  CHOL 177   Cardiac Panel (last 3 results)  Recent Labs  05/14/16 1900 05/14/16 2131 05/15/16 0330  CKTOTAL 241*  --   --   CKMB 3.4  --   --   TROPONINI 0.46* 0.45* 0.39*  RELINDX 1.4  --   --     Studies/Results: 2D Echo 05/15/16 Study Conclusions  - Left ventricle: The cavity size was normal. There was moderate   concentric hypertrophy. Systolic function was normal. The   estimated ejection fraction was in the range of 60% to 65%. Wall   motion was normal; there were no regional wall motion   abnormalities. Doppler parameters are consistent with abnormal   left ventricular relaxation (grade 1 diastolic dysfunction).   There was no evidence of elevated ventricular filling pressure by   Doppler parameters. - Aortic valve: Trileaflet; mildly thickened, moderately calcified   leaflets. There was mild to moderate stenosis. There was no   regurgitation. Mean gradient (S): 8 mm Hg. Peak gradient (S): 18   mm Hg. Valve area (VTI): 1.73 cm^2. Valve area (Vmax): 1.57 cm^2.   Valve area (Vmean): 1.65 cm^2. - Right ventricle: The cavity size was normal. Wall thickness was   normal. Systolic function was normal. - Right atrium: The atrium was normal in size. - Tricuspid valve: There was trivial regurgitation. - Pulmonic valve: There was no regurgitation. - Pulmonary arteries: Systolic pressure was within the normal   range. - Inferior vena cava: The vessel was normal in size. - Pericardium, extracardiac: There was no pericardial effusion.   Assessment/Plan    Principal Problem:   SOB (shortness of breath) Active Problems:   Precordial chest pain   Elevated troponin   Abnormal ECG   Chest pain, moderate coronary artery risk   1. NSTEMI: troponin  peaked at 0.46. She is currently CP free. Echo shows normal LVEF. Continue IV heparin. Plan for [facility] today +/- PCI. Continue statin.   2. Hyperlipidemia  - Continue with atorvastatin high intensity dose 40 mg  - LDL 126  3. Essential hypertension  - Currently well controlled.  - continue Avapro and verapamil.   - If CAD noted on cath, consider addition of BB.    4. Tobacco use  - Encourage cessation  5. Aortic stenosis  - Mild to moderate on echocardiogram.    LOS: 3 days    Brittainy M. Ladoris Gene 05/17/2016 7:50 AM   Patient examined chart reviewed. AS murmur on exam. SEMI  For cath  Today.  Continue iv heparin given elevated troponins  Jenkins Rouge

## 2016-05-17 NOTE — Progress Notes (Signed)
Patient Profile: 70 y.o. female with a history of HTN, HLD, tobacco abuse, admitted for CP. Ruled in for NSTEMI.   Subjective: Doing well this am. Denies any CP today.   Objective: Vital signs in last 24 hours: Temp:  [98 F (36.7 C)-98.7 F (37.1 C)] 98 F (36.7 C) (09/18 0438) Pulse Rate:  [53-74] 62 (09/18 0438) Resp:  [17-24] 17 (09/18 0438) BP: (120-149)/(52-74) 132/74 (09/18 0438) SpO2:  [94 %-98 %] 98 % (09/18 0438) Weight:  [164 lb 14.4 oz (74.8 kg)] 164 lb 14.4 oz (74.8 kg) (09/18 0438) Last BM Date: 05/14/16  Intake/Output from previous day: 09/17 0701 - 09/18 0700 In: 1520 [P.O.:480; I.V.:1040] Out: 1500 [Urine:1500] Intake/Output this shift: No intake/output data recorded.  Medications Current Facility-Administered Medications  Medication Dose Route Frequency Provider Last Rate Last Dose  . 0.9 %  sodium chloride infusion  250 mL Intravenous PRN Evelene Croon Barrett, PA-C 10 mL/hr at 05/15/16 2354 250 mL at 05/15/16 2354  . 0.9 %  sodium chloride infusion  250 mL Intravenous PRN Bhavinkumar Bhagat, PA      . 0.9% sodium chloride infusion  1 mL/kg/hr Intravenous Continuous Bhavinkumar Bhagat, PA 72.1 mL/hr at 05/16/16 2149 1 mL/kg/hr at 05/16/16 2149  . acetaminophen (TYLENOL) tablet 650 mg  650 mg Oral Q4H PRN Rhonda G Barrett, PA-C      . ALPRAZolam Duanne Moron) tablet 0.25 mg  0.25 mg Oral BID PRN Evelene Croon Barrett, PA-C   0.25 mg at 05/15/16 2130  . atorvastatin (LIPITOR) tablet 40 mg  40 mg Oral QHS Rhonda G Barrett, PA-C   40 mg at 05/16/16 2112  . chlorpheniramine-HYDROcodone (TUSSIONEX) 10-8 MG/5ML suspension 5 mL  5 mL Oral Q12H PRN Evelene Croon Barrett, PA-C   5 mL at 05/16/16 2112  . feeding supplement (ENSURE ENLIVE) (ENSURE ENLIVE) liquid 237 mL  237 mL Oral BID BM Lorretta Harp, MD   237 mL at 05/16/16 1440  . guaiFENesin (MUCINEX) 12 hr tablet 1,200 mg  1,200 mg Oral BID Evelene Croon Barrett, PA-C   1,200 mg at 05/16/16 2112  . heparin ADULT infusion 100  units/mL (25000 units/273mL sodium chloride 0.45%)  800 Units/hr Intravenous Continuous Romona Curls, RPH 8 mL/hr at 05/16/16 2334 800 Units/hr at 05/16/16 2334  . ipratropium-albuterol (DUONEB) 0.5-2.5 (3) MG/3ML nebulizer solution 3 mL  3 mL Nebulization TID Lorretta Harp, MD   3 mL at 05/17/16 0733  . irbesartan (AVAPRO) tablet 37.5 mg  37.5 mg Oral Daily Rhonda G Barrett, PA-C   37.5 mg at 05/16/16 0843  . nitroGLYCERIN (NITROSTAT) SL tablet 0.4 mg  0.4 mg Sublingual Q5 Min x 3 PRN Rhonda G Barrett, PA-C      . ondansetron (ZOFRAN) injection 4 mg  4 mg Intravenous Q6H PRN Rhonda G Barrett, PA-C      . potassium chloride SA (K-DUR,KLOR-CON) CR tablet 40 mEq  40 mEq Oral Daily Lorretta Harp, MD   40 mEq at 05/16/16 0842  . sodium chloride flush (NS) 0.9 % injection 3 mL  3 mL Intravenous Q12H Rhonda G Barrett, PA-C   3 mL at 05/15/16 2105  . sodium chloride flush (NS) 0.9 % injection 3 mL  3 mL Intravenous PRN Rhonda G Barrett, PA-C      . sodium chloride flush (NS) 0.9 % injection 3 mL  3 mL Intravenous Q12H Bhavinkumar Bhagat, PA      . sodium chloride flush (NS) 0.9 % injection 3 mL  3 mL Intravenous PRN Bhavinkumar Bhagat, PA      . verapamil (CALAN-SR) CR tablet 360 mg  360 mg Oral Daily Rhonda G Barrett, PA-C   360 mg at 05/16/16 [id number]  . zolpidem (AMBIEN) tablet 5 mg  5 mg Oral QHS PRN Evelene Croon Barrett, PA-C   5 mg at 05/14/16 2125    PE: General appearance: alert, cooperative and no distress Neck: no carotid bruit and no JVD Lungs: expriatory wheezing bilaterally, anteriorlly Abdomen: soft, non-tender; bowel sounds normal; no masses,  no organomegaly Cardiac: RRR  Extremities: warm and dry Pulses: 2+ and symmetric Skin: warm and dry Neurologic: Grossly normal  Lab Results:   Recent Labs  05/15/16 0330 05/16/16 0452 05/17/16 0523  WBC 5.2 5.0 5.2  HGB 13.3 13.1 11.9*  HCT 40.9 41.0 37.3  PLT 177 204 196   BMET  Recent Labs  05/14/16 1335 05/15/16 0330  NA 136  135  K 4.2 3.6  CL 101 100*  CO2 26 26  GLUCOSE 98 131*  BUN 9 9  CREATININE 0.80 0.86  CALCIUM 10.3 9.5   PT/INR  Recent Labs  05/16/16 0856  LABPROT 14.0  INR 1.08   Cholesterol  Recent Labs  05/15/16 0330  CHOL 177   Cardiac Panel (last 3 results)  Recent Labs  05/14/16 1900 05/14/16 2131 05/15/16 0330  CKTOTAL 241*  --   --   CKMB 3.4  --   --   TROPONINI 0.46* 0.45* 0.39*  RELINDX 1.4  --   --     Studies/Results: 2D Echo 05/15/16 Study Conclusions  - Left ventricle: The cavity size was normal. There was moderate   concentric hypertrophy. Systolic function was normal. The   estimated ejection fraction was in the range of 60% to 65%. Wall   motion was normal; there were no regional wall motion   abnormalities. Doppler parameters are consistent with abnormal   left ventricular relaxation (grade 1 diastolic dysfunction).   There was no evidence of elevated ventricular filling pressure by   Doppler parameters. - Aortic valve: Trileaflet; mildly thickened, moderately calcified   leaflets. There was mild to moderate stenosis. There was no   regurgitation. Mean gradient (S): 8 mm Hg. Peak gradient (S): 18   mm Hg. Valve area (VTI): 1.73 cm^2. Valve area (Vmax): 1.57 cm^2.   Valve area (Vmean): 1.65 cm^2. - Right ventricle: The cavity size was normal. Wall thickness was   normal. Systolic function was normal. - Right atrium: The atrium was normal in size. - Tricuspid valve: There was trivial regurgitation. - Pulmonic valve: There was no regurgitation. - Pulmonary arteries: Systolic pressure was within the normal   range. - Inferior vena cava: The vessel was normal in size. - Pericardium, extracardiac: There was no pericardial effusion.   Assessment/Plan    Principal Problem:   SOB (shortness of breath) Active Problems:   Precordial chest pain   Elevated troponin   Abnormal ECG   Chest pain, moderate coronary artery risk   1. NSTEMI: troponin  peaked at 0.46. She is currently CP free. Echo shows normal LVEF. Continue IV heparin. Plan for [facility] today +/- PCI. Continue statin.   2. Hyperlipidemia  - Continue with atorvastatin high intensity dose 40 mg  - LDL 126  3. Essential hypertension  - Currently well controlled.  - continue Avapro and verapamil.   - If CAD noted on cath, consider addition of BB.    4. Tobacco use  - Encourage cessation  5. Aortic stenosis  - Mild to moderate on echocardiogram.    LOS: 3 days    Brittainy M. Ladoris Gene 05/17/2016 7:50 AM   Patient examined chart reviewed. AS murmur on exam. SEMI  For cath  Today.  Continue iv heparin given elevated troponins  Jenkins Rouge

## 2016-05-17 NOTE — Care Management Note (Signed)
Case Management Note  Patient Details  Name: HEENA VALLEROY MRN: [id number] Date of Birth: December 06, 1945  Subjective/Objective:    Patient is s/p  Coronary stent intervention, will be on brilinta.  Copay is $3.70 with medicaid. NCM asked if patient needs [facility] services and explained what [facility] services is, she states no she does not need that, she needs pcs services set up so she can have someone to come help clean up her home.  NCM gave her the [facility] out of [place] form to take to her pcp to have him fill out for her to request for pcs services. NCM will cont to follow for dc needs.                 Action/Plan:   Expected Discharge Date:                  Expected Discharge Plan:  Home/Self Care  In-House Referral:     Discharge planning Services  CM Consult  Post Acute Care Choice:    Choice offered to:     DME Arranged:    DME Agency:     HH Arranged:    HH Agency:     Status of Service:  Completed, signed off  If discussed at [employer] of Stay Meetings, dates discussed:    Additional Comments:  Zenon Mayo, RN 05/17/2016, 4:46 PM

## 2016-05-18 LAB — BASIC METABOLIC PANEL
Anion gap: 8 (ref 5–15)
BUN: 7 mg/dL (ref 6–20)
CALCIUM: 9.9 mg/dL (ref 8.9–10.3)
CO2: 25 mmol/L (ref 22–32)
Chloride: 104 mmol/L (ref 101–111)
Creatinine, Ser: 0.8 mg/dL (ref 0.44–1.00)
GFR calc non Af Amer: >60 mL/min (ref >60)
Glucose, Bld: 102 mg/dL — ABNORMAL HIGH (ref 65–99)
Potassium: 4.3 mmol/L (ref 3.5–5.1)
SODIUM: 137 mmol/L (ref 135–145)

## 2016-05-18 LAB — CBC
HCT: 38.8 % (ref 36.0–46.0)
Hemoglobin: 12.1 g/dL (ref 12.0–15.0)
MCH: 24.6 pg — AB (ref 26.0–34.0)
MCHC: 31.2 g/dL (ref 30.0–36.0)
MCV: 79 fL (ref 78.0–100.0)
PLATELETS: 221 10*3/uL (ref 150–400)
RBC: 4.91 MIL/uL (ref 3.87–5.11)
RDW: 15.2 % (ref 11.5–15.5)
WBC: 4.9 10*3/uL (ref 4.0–10.5)

## 2016-05-18 MED ORDER — ATORVASTATIN CALCIUM 80 MG PO TABS: 80.0000 mg | ORAL_TABLET | Freq: Every day | ORAL | 5 refills | Status: DC

## 2016-05-18 MED ORDER — TICAGRELOR 90 MG PO TABS: 90.0000 mg | ORAL_TABLET | Freq: Two times a day (BID) | ORAL | 3 refills | Status: DC

## 2016-05-18 MED ORDER — AMLODIPINE BESYLATE 10 MG PO TABS: 10.0000 mg | ORAL_TABLET | Freq: Every day | ORAL | 5 refills | Status: DC

## 2016-05-18 MED ORDER — TICAGRELOR 90 MG PO TABS: 90.0000 mg | ORAL_TABLET | Freq: Two times a day (BID) | ORAL | 0 refills | Status: DC

## 2016-05-18 MED ORDER — NITROGLYCERIN 0.4 MG SL SUBL: 0.4000 mg | SUBLINGUAL_TABLET | SUBLINGUAL | 2 refills | Status: AC | PRN

## 2016-05-18 MED FILL — Tirofiban HCl in NaCl 0.9% IV Soln 5 MG/100ML (Base Equiv): INTRAVENOUS | Qty: 100 | Status: AC

## 2016-05-18 NOTE — Progress Notes (Signed)
Offered Pt a bath, Pt stated she would like to rest for now and will take care of bath later. Tech asked Pt to notify her when she was ready and assistance with bath will be provided. Pt acknowledged.

## 2016-05-18 NOTE — Progress Notes (Signed)
Patient Profile: 70 y.o. female with a history of HTN, HLD, tobacco abuse, admitted for CP. Ruled in for NSTEMI.   Subjective: Day 1 s/p PCI. Doing well. Denies any recurrent CP. No dyspnea. Radial cath site/ right hand ok w/o complication.   Objective: Vital signs in last 24 hours: Temp:  [98 F (36.7 C)-98.5 F (36.9 C)] 98 F (36.7 C) (09/19 0410) Pulse Rate:  [0-94] 72 (09/19 0410) Resp:  [0-29] 17 (09/19 0410) BP: (122-183)/(42-93) 141/58 (09/19 0410) SpO2:  [0 %-100 %] 95 % (09/19 0410) Weight:  [163 lb 2.3 oz (74 kg)] 163 lb 2.3 oz (74 kg) (09/19 0410) Last BM Date: 05/14/16  Intake/Output from previous day: 09/18 0701 - 09/19 0700 In: 622.4 [P.O.:260; I.V.:362.4] Out: 1900 [Urine:1900] Intake/Output this shift: No intake/output data recorded.  Medications Current Facility-Administered Medications  Medication Dose Route Frequency Provider Last Rate Last Dose  . 0.9 %  sodium chloride infusion  250 mL Intravenous PRN Belva Crome, MD      . acetaminophen (TYLENOL) tablet 650 mg  650 mg Oral Q4H PRN Belva Crome, MD      . ALPRAZolam Duanne Moron) tablet 0.25 mg  0.25 mg Oral BID PRN Evelene Croon Barrett, PA-C   0.25 mg at 05/15/16 2130  . amLODipine (NORVASC) tablet 5 mg  5 mg Oral Daily Larey Dresser, MD   5 mg at 05/17/16 2222  . atorvastatin (LIPITOR) tablet 80 mg  80 mg Oral q1800 Belva Crome, MD   80 mg at 05/17/16 1731  . chlorpheniramine-HYDROcodone (TUSSIONEX) 10-8 MG/5ML suspension 5 mL  5 mL Oral Q12H PRN Evelene Croon Barrett, PA-C   5 mL at 05/17/16 1347  . enoxaparin (LOVENOX) injection 40 mg  40 mg Subcutaneous Q24H Belva Crome, MD      . feeding supplement (ENSURE ENLIVE) (ENSURE ENLIVE) liquid 237 mL  237 mL Oral BID BM Lorretta Harp, MD   237 mL at 05/17/16 1657  . guaiFENesin (MUCINEX) 12 hr tablet 1,200 mg  1,200 mg Oral BID Evelene Croon Barrett, PA-C   1,200 mg at 05/17/16 2130  . ipratropium-albuterol (DUONEB) 0.5-2.5 (3) MG/3ML nebulizer solution 3 mL   3 mL Nebulization TID Lorretta Harp, MD   3 mL at 05/17/16 1925  . irbesartan (AVAPRO) tablet 37.5 mg  37.5 mg Oral Daily Rhonda G Barrett, PA-C   37.5 mg at 05/16/16 0843  . morphine 2 MG/ML injection 2 mg  2 mg Intravenous Q2H PRN Belva Crome, MD      . nitroGLYCERIN (NITROSTAT) SL tablet 0.4 mg  0.4 mg Sublingual Q5 Min x 3 PRN Evelene Croon Barrett, PA-C      . ondansetron (ZOFRAN) injection 4 mg  4 mg Intravenous Q6H PRN Belva Crome, MD   4 mg at 05/17/16 1347  . oxyCODONE-acetaminophen (PERCOCET/ROXICET) 5-325 MG per tablet 1-2 tablet  1-2 tablet Oral Q4H PRN Belva Crome, MD      . potassium chloride SA (K-DUR,KLOR-CON) CR tablet 40 mEq  40 mEq Oral Daily Lorretta Harp, MD   40 mEq at 05/17/16 0815  . sodium chloride flush (NS) 0.9 % injection 3 mL  3 mL Intravenous Q12H Belva Crome, MD      . sodium chloride flush (NS) 0.9 % injection 3 mL  3 mL Intravenous PRN Belva Crome, MD      . ticagrelor [facility]) tablet 90 mg  90 mg Oral BID Lynnell Dike  Tamala Julian, MD   90 mg at 05/18/16 0012  . zolpidem (AMBIEN) tablet 5 mg  5 mg Oral QHS PRN Lonn Georgia, PA-C   5 mg at 05/17/16 2130    PE: General appearance: alert, cooperative and no distress Neck: no carotid bruit and no JVD Lungs: expriatory wheezing bilaterally, anteriorlly Abdomen: soft, non-tender; bowel sounds normal; no masses,  no organomegaly Cardiac: RRR, 2/6 AS murmur   Extremities: warm and dry Pulses: 2+ and symmetric Skin: warm and dry Neurologic: Grossly normal  Lab Results:   Recent Labs  05/17/16 0523 05/17/16 1422 05/18/16 0349  WBC 5.2 4.8 4.9  HGB 11.9* 12.0 12.1  HCT 37.3 37.6 38.8  PLT 196 199 221   BMET  Recent Labs  05/17/16 1422 05/18/16 0349  NA  --  137  K  --  4.3  CL  --  104  CO2  --  25  GLUCOSE  --  102*  BUN  --  7  CREATININE 0.78 0.80  CALCIUM  --  9.9   PT/INR  Recent Labs  05/16/16 0856  LABPROT 14.0  INR 1.08   Cholesterol No results for input(s): CHOL in the  last 72 hours. Cardiac Panel (last 3 results) No results for input(s): CKTOTAL, CKMB, TROPONINI, RELINDX in the last 72 hours.  Studies/Results: 2D Echo 05/15/16 Study Conclusions  - Left ventricle: The cavity size was normal. There was moderate   concentric hypertrophy. Systolic function was normal. The   estimated ejection fraction was in the range of 60% to 65%. Wall   motion was normal; there were no regional wall motion   abnormalities. Doppler parameters are consistent with abnormal   left ventricular relaxation (grade 1 diastolic dysfunction).   There was no evidence of elevated ventricular filling pressure by   Doppler parameters. - Aortic valve: Trileaflet; mildly thickened, moderately calcified   leaflets. There was mild to moderate stenosis. There was no   regurgitation. Mean gradient (S): 8 mm Hg. Peak gradient (S): 18   mm Hg. Valve area (VTI): 1.73 cm^2. Valve area (Vmax): 1.57 cm^2.   Valve area (Vmean): 1.65 cm^2. - Right ventricle: The cavity size was normal. Wall thickness was   normal. Systolic function was normal. - Right atrium: The atrium was normal in size. - Tricuspid valve: There was trivial regurgitation. - Pulmonic valve: There was no regurgitation. - Pulmonary arteries: Systolic pressure was within the normal   range. - Inferior vena cava: The vessel was normal in size. - Pericardium, extracardiac: There was no pericardial effusion.   Procedures   Coronary Stent Intervention  Left Heart Cath and Coronary Angiography  Conclusion     The left ventricular ejection fraction is 55-65% by visual estimate.  LV end diastolic pressure is normal.  The left ventricular systolic function is normal.  Prox Cx to Mid Cx lesion, 80 %stenosed.  1st Diag lesion, 85 %stenosed.  A STENT PROMUS PREM MR 2.5X20 drug eluting stent was successfully placed.  Prox RCA to Mid RCA lesion, 90 %stenosed.  Post intervention, there is a 0% residual stenosis.  A  STENT PROMUS PREM MR 2.25X20 stent was successfully placed.  Dist RCA lesion, 95 %stenosed.  Post intervention, there is a 0% residual stenosis.    Non-ST elevation myocardial infarction due to high-grade obstruction in the mid and distal RCA with both greater than 90-95% lesions reduced to 0% after drug-eluting stent implantation. TIMI grade 3 flow was noted. The mid stent postdilated to 2.75  mm in diameter. The distal most lesion and was deployed a 2.25 mm in diameter.  Significant proximal to mid circumflex disease with up to 80% stenosis within a very tortuous bifurcation segment.  High-grade stenosis in the first diagonal.  Widely patent LAD.  Normal LV function.     Assessment/Plan    Principal Problem:   Elevated troponin Active Problems:   Precordial chest pain   SOB (shortness of breath)   Abnormal ECG   Chest pain, moderate coronary artery risk   1. NSTEMI: troponin peaked at 0.46. Oriental 9/18 showed high-grade obstruction in the mid and distal RCA with both greater than 90-95% lesions reduced to 0% after drug-eluting stent implantation. Also residual significant proximal to mid circumflex disease with up to 80% stenosis within a very tortuous bifurcation segment and high-grade stenosis in the first diagonal. Widely patent LAD. Normal LVEF. Will treat residual CAD medically. If any recurrent angina, despite medical therapy, can consider PCI of the circumflex and diagonal. Circumflex would be at higher risk due to tortuosity/angulation. She is CP free. Plan to ambulated with cardiac rehab today prior to d/c. Continue DAPT with ASA + Brilinta, high intensity statin, and ARB. Resting HR is in the 80s-90s. Recommend addition of BB for CAD.   2. Hyperlipidemia  - Continue with atorvastatin high intensity dose 40 mg  - LDL 126  3. Essential hypertension  - Currently well controlled.  - continue Avapro and verapamil.   - CAD noted on cath, consider addition of BB.    4.  Tobacco use  - Encourage cessation  5. Aortic stenosis  - Mild to moderate on echocardiogram.   Dispo: likely d/c home later today. Will arrange office f/u in 1-2 weeks.    LOS: 4 days    Brittainy M. Ladoris Gene 05/18/2016 7:46 AM   Patient examined chart reviewed Right radial A Has been OOB ambulating with no pains Increase Amlodipine to 10 mg for BP and can consider adding beta Blocker as outpatient Ok for d/c today.    Jenkins Rouge

## 2016-05-18 NOTE — Progress Notes (Signed)
CARDIAC REHAB PHASE I   PRE:  Rate/Rhythm: 2 SR  BP:  Sitting: 150/77        SaO2: 96 RA  MODE:  Ambulation: 250 ft   POST:  Rate/Rhythm: 102 ST  BP:  Sitting: 160/70         SaO2: 96 RA  Pt ambulated 250 ft on RA, handheld assist, fairly steady gait, tolerated fairly well.  Pt c/o mild DOE, productive cough, denies cp, dizziness, declined rest stop. Completed MI/stent education.  Reviewed risk factors, tobacco cessation, MI book, anti-platelet therapy, stent card, activity restrictions, ntg, exercise, heart healthy diet, and phase 2 cardiac rehab. Pt verbalized understanding, receptive to education, states she is ready to quit smoking. Pt agrees to phase 2 cardiac rehab referral, will send to [facility] per pt request. Pt to bed per pt request after walk, call bell within reach.    [id number] Lenna Sciara, RN, BSN 05/18/2016 8:58 AM

## 2016-05-18 NOTE — Discharge Summary (Signed)
Discharge Summary    Patient ID: [name],  MRN: [id number], DOB/AGE: [birth year] 70 y.o.  Admit date: 05/14/2016 Discharge date: 05/18/2016  Primary Care Provider: Marton Redwood Primary Cardiologist: Dr. Johnsie Cancel   Discharge Diagnoses    Principal Problem:   Elevated troponin Active Problems:   Precordial chest pain   SOB (shortness of breath)   Abnormal ECG   Chest pain, moderate coronary artery risk   Allergies Allergies  Allergen Reactions  . Aspirin Shortness Of Breath and Swelling    Diagnostic Studies/Procedures   Procedures   Coronary Stent Intervention  Left Heart Cath and Coronary Angiography  Conclusion     The left ventricular ejection fraction is 55-65% by visual estimate.  LV end diastolic pressure is normal.  The left ventricular systolic function is normal.  Prox Cx to Mid Cx lesion, 80 %stenosed.  1st Diag lesion, 85 %stenosed.  A STENT PROMUS PREM MR 2.5X20 drug eluting stent was successfully placed.  Prox RCA to Mid RCA lesion, 90 %stenosed.  Post intervention, there is a 0% residual stenosis.  A STENT PROMUS PREM MR 2.25X20 stent was successfully placed.  Dist RCA lesion, 95 %stenosed.  Post intervention, there is a 0% residual stenosis.    Non-ST elevation myocardial infarction due to high-grade obstruction in the mid and distal RCA with both greater than 90-95% lesions reduced to 0% after drug-eluting stent implantation. TIMI grade 3 flow was noted. The mid stent postdilated to 2.75 mm in diameter. The distal most lesion and was deployed a 2.25 mm in diameter.  Significant proximal to mid circumflex disease with up to 80% stenosis within a very tortuous bifurcation segment.  High-grade stenosis in the first diagonal.  Widely patent LAD.  Normal LV function.    2D Echo  Study Conclusions  - Left ventricle: The cavity size was normal. There was moderate   concentric hypertrophy. Systolic function was normal. The  estimated ejection fraction was in the range of 60% to 65%. Wall   motion was normal; there were no regional wall motion   abnormalities. Doppler parameters are consistent with abnormal   left ventricular relaxation (grade 1 diastolic dysfunction).   There was no evidence of elevated ventricular filling pressure by   Doppler parameters. - Aortic valve: Trileaflet; mildly thickened, moderately calcified   leaflets. There was mild to moderate stenosis. There was no   regurgitation. Mean gradient (S): 8 mm Hg. Peak gradient (S): 18   mm Hg. Valve area (VTI): 1.73 cm^2. Valve area (Vmax): 1.57 cm^2.   Valve area (Vmean): 1.65 cm^2. - Right ventricle: The cavity size was normal. Wall thickness was   normal. Systolic function was normal. - Right atrium: The atrium was normal in size. - Tricuspid valve: There was trivial regurgitation. - Pulmonic valve: There was no regurgitation. - Pulmonary arteries: Systolic pressure was within the normal   range. - Inferior vena cava: The vessel was normal in size. - Pericardium, extracardiac: There was no pericardial effusion.    History of Present Illness     70 y/o AAF with a h/o tobacco abuse (recently quit), HTN and HLD, who presented to [facility] on 05/14/16 with a complaint of CP and dyspnea. Prior to this, she was see in the ER on 9/11 with GI symptoms. Her EKG at that time showed new anterolateral TWIs. She was not admitted at that time. On 9/15, she was seen by Dr. Gwenlyn Found who reviewed her EKGs. Her new EKG showed near resolution  of Twave abnormalities that were seen on EKG 9/11. POC troponin was also abnormal at 0.37. Given her risk factors, symptoms, troponin and EKG abnormalities, she was admitted for further w/u.  Hospital Course     Patient was admitted to telemetry. Cardiac enzymes were cycled and were abnormal x 3, peaking at 0.46. She was placed on IV heparin. FLP was also obtained which demontrated an LDL of 126 mg/dL. She was placed on high  intensity statin therapy with Lipitor. On 05/17/16, she underwent a LHC performed by Dr. Tamala Julian. She was found to have  high-grade obstruction in the mid and distal RCA with both greater than 90-95% lesions reduced to 0% after drug-eluting stent implantation. Also residual significant proximal to mid circumflex disease with up to 80% stenosis within a very tortuous bifurcation segment and high-grade stenosis in the first diagonal. Widely patent LAD. Normal LVEF. Decision was made to treat residual CAD medically. If any recurrent angina, despite medical therapy, can consider PCI of the circumflex and diagonal. Circumflex would be at higher risk due to tortuosity/angulation. She tolerated the procedure well and left the cath lab in stable condition. She was placed on mono antiplatelet therapy with Brilinta (ASA Allergy), + amlodipine. She was monitored overnight and had no recurrent CP and no post cath complications. She ambulated with cardiac rehab w/o difficulty. Vital signs, renal function and cath site remained stable. She was last seen and examined by Dr. Johnsie Cancel, who determined she was stable for d/c home. Post hospital f/u has been arranged in 2 weeks with Angelena Form, PA-C, on 06/01/16.    Consultants: none     Discharge Vitals Blood pressure (!) 150/77, pulse 85, temperature 98.4 F (36.9 C), temperature source Oral, resp. rate (!) 23, height 5' 1.5" (1.562 m), weight 163 lb 2.3 oz (74 kg), SpO2 99 %.  Filed Weights   05/16/16 0441 05/17/16 0438 05/18/16 0410  Weight: 162 lb (73.5 kg) 164 lb 14.4 oz (74.8 kg) 163 lb 2.3 oz (74 kg)    Labs & Radiologic Studies    CBC  Recent Labs  05/17/16 1422 05/18/16 0349  WBC 4.8 4.9  HGB 12.0 12.1  HCT 37.6 38.8  MCV 78.3 79.0  PLT 199 [id number]   Basic Metabolic Panel  Recent Labs  05/17/16 1422 05/18/16 0349  NA  --  137  K  --  4.3  CL  --  104  CO2  --  25  GLUCOSE  --  102*  BUN  --  7  CREATININE 0.78 0.80  CALCIUM  --  9.9    Liver Function Tests No results for input(s): AST, ALT, ALKPHOS, BILITOT, PROT, ALBUMIN in the last 72 hours. No results for input(s): LIPASE, AMYLASE in the last 72 hours. Cardiac Enzymes No results for input(s): CKTOTAL, CKMB, CKMBINDEX, TROPONINI in the last 72 hours. BNP Invalid input(s): POCBNP D-Dimer No results for input(s): DDIMER in the last 72 hours. Hemoglobin A1C No results for input(s): HGBA1C in the last 72 hours. Fasting Lipid Panel No results for input(s): CHOL, HDL, LDLCALC, TRIG, CHOLHDL, LDLDIRECT in the last 72 hours. Thyroid Function Tests No results for input(s): TSH, T4TOTAL, T3FREE, THYROIDAB in the last 72 hours.  Invalid input(s): FREET3 _____________  Dg Chest 2 View  Result Date: 05/14/2016 CLINICAL DATA:  Chest pain and cough for 2 weeks. EXAM: CHEST  2 VIEW COMPARISON:  09/28/2011 FINDINGS: The cardiac silhouette, mediastinal and hilar contours are within normal limits stable. There is moderate tortuosity of  the thoracic aorta. Streaky bibasilar atelectasis but no infiltrates, edema or effusions. The bony thorax is intact. IMPRESSION: Streaky bibasilar atelectasis but no infiltrates, edema or effusions. Electronically Signed   By: Marijo Sanes M.D.   On: 05/14/2016 13:06   Ct Abdomen Pelvis W Contrast  Result Date: 05/10/2016 CLINICAL DATA:  Generalized abdominal pain beginning yesterday with 1 episode of vomiting. EXAM: CT ABDOMEN AND PELVIS WITH CONTRAST TECHNIQUE: Multidetector CT imaging of the abdomen and pelvis was performed using the standard protocol following bolus administration of intravenous contrast. CONTRAST:  163mL ISOVUE-300 IOPAMIDOL (ISOVUE-300) INJECTION 61% COMPARISON:  Abdominal ultrasound 02/03/2007 FINDINGS: Lower chest: No acute abnormality. Hepatobiliary: Unremarkable appearance of the liver and gallbladder. No biliary dilatation. Pancreas: Unremarkable. Spleen: Unremarkable. Adrenals/Urinary Tract: Unremarkable appearance of the  adrenal glands. No hydronephrosis. 2.5 cm right upper pole renal cyst. Subcentimeter low-density lesions in both kidneys are too small to fully characterize. Vascular calcifications in the right renal hilum. Bladder is largely decompressed. Stomach/Bowel: Stomach is within normal limits. No evidence of bowel obstruction. Mild descending and sigmoid colon diverticulosis without evidence of diverticulitis. Unremarkable appendix. Vascular/Lymphatic: Diffuse atherosclerosis of the abdominal aorta and its major branch vessels. No abdominal aortic aneurysm. No enlarged lymph nodes. Reproductive: Uterus and bilateral adnexa are unremarkable. Other: No abdominal wall hernia or abnormality. No abdominopelvic ascites. Musculoskeletal: No acute osseous abnormality. No suspicious lytic or blastic osseous lesion. IMPRESSION: 1. No acute abnormality identified in the abdomen or pelvis. 2. Aortic atherosclerosis. Electronically Signed   By: Logan Bores M.D.   On: 05/10/2016 21:23   Disposition   Pt is being discharged home today in good condition.  Follow-up Plans & Appointments    Follow-up Information    Angelena Form, PA-C Follow up on 06/01/2016.   Specialties:  Cardiology, Radiology Why:  9:30 AM (Cardiology Follow-up Appointment) Contact information: [place] [phone]          Discharge Instructions    Amb Referral to Cardiac Rehabilitation    Complete by:  As directed    Diagnosis:   Coronary Stents NSTEMI        Discharge Medications   Current Discharge Medication List    START taking these medications   Details  amLODipine (NORVASC) 10 MG tablet Take 1 tablet (10 mg total) by mouth daily. Qty: 30 tablet, Refills: 5    nitroGLYCERIN (NITROSTAT) 0.4 MG SL tablet Place 1 tablet (0.4 mg total) under the tongue every 5 (five) minutes x 3 doses as needed for chest pain. Qty: 25 tablet, Refills: 2    !! ticagrelor (BRILINTA) 90 MG TABS  tablet Take 1 tablet (90 mg total) by mouth 2 (two) times daily. Qty: 180 tablet, Refills: 3    !! ticagrelor (BRILINTA) 90 MG TABS tablet Take 1 tablet (90 mg total) by mouth 2 (two) times daily. Qty: 60 tablet, Refills: 0     !! - Potential duplicate medications found. Please discuss with provider.    CONTINUE these medications which have CHANGED   Details  atorvastatin (LIPITOR) 80 MG tablet Take 1 tablet (80 mg total) by mouth at bedtime. Qty: 30 tablet, Refills: 5      CONTINUE these medications which have NOT CHANGED   Details  olmesartan (BENICAR) 40 MG tablet Take 40 mg by mouth daily.    potassium chloride SA (K-DUR,KLOR-CON) 20 MEQ tablet Take 2 tablets (40 mEq total) by mouth daily. Qty: 6 tablet, Refills: 0  STOP taking these medications     verapamil (VERELAN PM) 180 MG 24 hr capsule          Aspirin prescribed at discharge?  No - Allergy High Intensity Statin Prescribed? (Lipitor 40-80mg  or Crestor 20-40mg ): Yes Beta Blocker Prescribed? No: consider adding at time of hospital f/u For EF <40%, was ACEI/ARB Prescribed? Yes ADP Receptor Inhibitor Prescribed? (i.e. Plavix etc.-Includes Medically Managed Patients): Yes For EF <40%, Aldosterone Inhibitor Prescribed? No: EF >40% Was EF assessed during THIS hospitalization? Yes Was Cardiac Rehab II ordered? (Included Medically managed Patients): Yes   Outstanding Labs/Studies   None   Duration of Discharge Encounter   Greater than 30 minutes including physician time.  Signed, Lyda Jester PA-C 05/18/2016, 10:07 AM

## 2016-05-19 ENCOUNTER — Telehealth: Payer: Self-pay | Admitting: Cardiovascular Disease

## 2016-05-19 MED ORDER — POTASSIUM CHLORIDE CRYS ER 20 MEQ PO TBCR: 40.0000 meq | EXTENDED_RELEASE_TABLET | Freq: Every day | ORAL | 1 refills | Status: DC

## 2016-05-19 MED ORDER — OLMESARTAN MEDOXOMIL 40 MG PO TABS: 40.0000 mg | ORAL_TABLET | Freq: Every day | ORAL | 1 refills | Status: DC

## 2016-05-19 NOTE — Telephone Encounter (Signed)
Returned call. Clarified needed refills and that patient has rx on hand for all other prescribed medications. Rx sent to pt's preferred pharmacy, she voiced acknowledgment and thanks.

## 2016-05-19 NOTE — Telephone Encounter (Signed)
New message     *STAT* If patient is at the pharmacy, call can be transferred to refill team.   1. Which medications need to be refilled? (please list name of each medication and dose if known) Olmesartam 40 mg, and potassium chloride 20 meq tablets  2. Which pharmacy/location (including street and city if local pharmacy) is medication to be sent to? [place] [place] [facility] [address], [phone]  3. Do they need a 30 day or 90 day supply? Not sure

## 2016-05-28 NOTE — Progress Notes (Signed)
Cardiology Office Note    Date:  06/01/2016   ID:  Elizabeth, [name] 1946-02-18, MRN [id number]  PCP:  Marton Redwood, MD  Cardiologist:  Dr. Johnsie Cancel   CC: post hospital follow up.   History of Present Illness:  [name] is a 70 y.o. female with a history of tobacco abuse (recently quit), HTN, HLD and recently diagnosed CAD with NSTEMI s/p DES to RCA  who presents to clinic for post hospital follow up.   She was recently admitted to [facility] from 9/15-9/19/17. She presented to [facility] on 05/14/16 with CP and dyspnea. POC troponin was abnormal at 0.37. She was admitted for further work up and management. Cardiac enzymes were cycled and were abnormal x 3, peaking at 0.46. She was placed on IV heparin. FLP was also obtained which demontrated an LDL of 126 mg/dL. She was placed on high intensity statin therapy with Lipitor. On 05/17/16, she underwent a LHC performed by Dr. Tamala Julian. She was found to have high-grade obstruction in the mid and distal RCA with both greater than 90-95% lesions reduced to 0% after drug-eluting stent implantation. Also residual significant proximal to mid circumflex disease with up to 80% stenosis within a very tortuous bifurcation segment and high-grade stenosis in the first diagonal. Widely patent LAD. Normal LVEF. Decision was made to treat residual CAD medically. If any recurrent angina, despite medical therapy, can consider PCI of the circumflex and diagonal. Circumflex would be at higher risk due to tortuosity/angulation. She was placed on mono antiplatelet therapy with Brilinta (due to an ASA Allergy), + amlodipine. She was monitored overnight and had no recurrent CP and no post cath complications.   Today she presents to clinic for follow up. She has been feeling okay since discharge. She still gets short winded and dizzy. Shortness of breath worse with minimal activity that is actually worse than prior to her heart attack. She has some short lived chest pain that radiates  into her arm and leg that was very different than the chest pain that brought her into hospital. She thinks it may be related to arthritis. She has quit smoking after 40 years (pack used lasted her 1 month). She has a lot of anxiety and depression and thinks she needs something for this. No LE edema, orthopnea but sometimes she wakes up in the middle of the night gasping for air. Some dizziness when she stands but no passing out.      Past Medical History:  Diagnosis Date  . Allergy   . Arthritis   . H/O echocardiogram 2008   EF 65%, no WMA  . High cholesterol   . Hypertension     Past Surgical History:  Procedure Laterality Date  . CARDIAC CATHETERIZATION N/A 05/17/2016   Procedure: Left Heart Cath and Coronary Angiography;  Surgeon: Belva Crome, MD;  Location: [facility];  Service: Cardiovascular;  Laterality: N/A;  . CARDIAC CATHETERIZATION N/A 05/17/2016   Procedure: Coronary Stent Intervention;  Surgeon: Belva Crome, MD;  Location: [facility];  Service: Cardiovascular;  Laterality: N/A;   mid and distal RCA  . CESAREAN SECTION    . COLONOSCOPY    . MOUTH SURGERY     pt not sure what was removed under her toungue  . NECK SURGERY      Current Medications: Outpatient Medications Prior to Visit  Medication Sig Dispense Refill  . amLODipine (NORVASC) 10 MG tablet Take 1 tablet (10 mg total) by  mouth daily. 30 tablet 5  . atorvastatin (LIPITOR) 80 MG tablet Take 1 tablet (80 mg total) by mouth at bedtime. 30 tablet 5  . nitroGLYCERIN (NITROSTAT) 0.4 MG SL tablet Place 1 tablet (0.4 mg total) under the tongue every 5 (five) minutes x 3 doses as needed for chest pain. 25 tablet 2  . olmesartan (BENICAR) 40 MG tablet Take 1 tablet (40 mg total) by mouth daily. 30 tablet 1  . ticagrelor (BRILINTA) 90 MG TABS tablet Take 1 tablet (90 mg total) by mouth 2 (two) times daily. 180 tablet 3  . potassium chloride SA (K-DUR,KLOR-CON) 20 MEQ tablet Take 2 tablets (40 mEq  total) by mouth daily. 60 tablet 1  . ticagrelor (BRILINTA) 90 MG TABS tablet Take 1 tablet (90 mg total) by mouth 2 (two) times daily. (Patient not taking: Reported on 06/01/2016) 60 tablet 0   No facility-administered medications prior to visit.      Allergies:   Aspirin   Social History   Social History  . Marital status: Single    Spouse name: N/A  . Number of children: N/A  . Years of education: N/A   Occupational History  . Retired from [employer] work    Social History Main Topics  . Smoking status: Former Smoker    Packs/day: 0.25    Types: Cigarettes    Quit date: 05/07/2016  . Smokeless tobacco: Never Used     Comment: Suddenly could not stand the smell  . Alcohol use No  . Drug use: No  . Sexual activity: Not Asked   Other Topics Concern  . None   Social History Narrative   Pt lives alone in [place], sister and a son live nearby.     Family History:  The patient's family history includes Stomach cancer in her sister.     ROS:   Please see the history of present illness.    ROS All other systems reviewed and are negative.   PHYSICAL EXAM:   VS:  BP 136/62   Pulse 72   Ht 5' 1.5" (1.562 m)   Wt 160 lb 1.9 oz (72.6 kg)   SpO2 96%   BMI 29.76 kg/m    GEN: Well nourished, well developed, in no acute distress  HEENT: normal  Neck: no JVD, carotid bruits, or masses Cardiac: RRR; no murmurs, rubs, or gallops,no edema  Respiratory:  clear to auscultation bilaterally, normal work of breathing GI: soft, nontender, nondistended, + BS MS: no deformity or atrophy  Skin: warm and dry, no rash Neuro:  Alert and Oriented x 3, Strength and sensation are intact Psych: euthymic mood, full affect  Wt Readings from Last 3 Encounters:  06/01/16 160 lb 1.9 oz (72.6 kg)  05/18/16 163 lb 2.3 oz (74 kg)  05/10/16 162 lb 3.2 oz (73.6 kg)      Studies/Labs Reviewed:   EKG:  EKG is ordered today.  The ekg ordered today demonstrates NSR HR 73. Non specific ST flattening  in diffuse leads  Recent Labs: 05/15/2016: ALT 16 05/18/2016: BUN 7; Creatinine, Ser 0.80; Hemoglobin 12.1; Platelets 221; Potassium 4.3; Sodium 137   Lipid Panel    Component Value Date/Time   CHOL 177 05/15/2016 0330   TRIG 98 05/15/2016 0330   HDL 31 (L) 05/15/2016 0330   CHOLHDL 5.7 05/15/2016 0330   VLDL 20 05/15/2016 0330   LDLCALC 126 (H) 05/15/2016 0330    Additional studies/ records that were reviewed today include:  05/17/16 Coronary Stent  Intervention  Left Heart Cath and Coronary Angiography  Conclusion    The left ventricular ejection fraction is 55-65% by visual estimate.  LV end diastolic pressure is normal.  The left ventricular systolic function is normal.  Prox Cx to Mid Cx lesion, 80 %stenosed.  1st Diag lesion, 85 %stenosed.  A STENT PROMUS PREM MR 2.5X20 drug eluting stent was successfully placed.  Prox RCA to Mid RCA lesion, 90 %stenosed.  Post intervention, there is a 0% residual stenosis.  A STENT PROMUS PREM MR 2.25X20 stent was successfully placed.  Dist RCA lesion, 95 %stenosed.  Post intervention, there is a 0% residual stenosis.   Non-ST elevation myocardial infarction due to high-grade obstruction in the mid and distal RCA with both greater than 90-95% lesions reduced to 0% after drug-eluting stent implantation. TIMI grade 3 flow was noted. The mid stent postdilated to 2.75 mm in diameter. The distal most lesion and was deployed a 2.25 mm in diameter.  Significant proximal to mid circumflex disease with up to 80% stenosis within a very tortuous bifurcation segment.  High-grade stenosis in the first diagonal.  Widely patent LAD.  Normal LV function.    2D Echo 05/15/16 Study Conclusions - Left ventricle: The cavity size was normal. There was moderate concentric hypertrophy. Systolic function was normal. The estimated ejection fraction was in the range of 60% to 65%. Wall motion was normal; there were no regional wall  motion abnormalities. Doppler parameters are consistent with abnormal left ventricular relaxation (grade 1 diastolic dysfunction). There was no evidence of elevated ventricular filling pressure by Doppler parameters. - Aortic valve: Trileaflet; mildly thickened, moderately calcified leaflets. There was mild to moderate stenosis. There was no regurgitation. Mean gradient (S): 8 mm Hg. Peak gradient (S): 18 mm Hg. Valve area (VTI): 1.73 cm^2. Valve area (Vmax): 1.57 cm^2. Valve area (Vmean): 1.65 cm^2. - Right ventricle: The cavity size was normal. Wall thickness was normal. Systolic function was normal. - Right atrium: The atrium was normal in size. - Tricuspid valve: There was trivial regurgitation. - Pulmonic valve: There was no regurgitation. - Pulmonary arteries: Systolic pressure was within the normal range. - Inferior vena cava: The vessel was normal in size. - Pericardium, extracardiac: There was no pericardial effusion.    ASSESSMENT & PLAN:   NSTEMI: troponin peaked at 0.46. [place] 9/18 showed high-grade obstruction in the mid and distal RCA with both greater than 90-95% lesions reduced to 0% after drug-eluting stent implantation. Also residual significant proximal to mid circumflex disease with up to 80% stenosis within a very tortuous bifurcation segment and high-grade stenosis in the first diagonal. Widely patent LAD. Normal LVEF. Will treat residual CAD medically. If any recurrent angina, despite medical therapy, can consider PCI of the circumflex and diagonal. Circumflex would be at higher risk due to tortuosity/angulation.  -- She has had some worsening SOB. Will continue to watch this for now, but if gets worse or doesn't improve, she may need to consider PCI of LCX and diag.  -- Continue single antiplatelet therapy with Brilinta (ASA allergy), high intensity statin, and ARB. Resting HR is in the 70s. Will start Coreg 3.125mg  BID today  Hyperlipidemia:   LDL 126. Continue with atorvastatin high intensity dose 80 mg  Essential hypertension:  Currently well controlled.  Tobacco use: counseled on importance of continued cessation  Aortic stenosis: mild to moderate on echocardiogram.    Medication Adjustments/Labs and Tests Ordered: Current medicines are reviewed at length with the patient today.  Concerns  regarding medicines are outlined above.  Medication changes, Labs and Tests ordered today are listed in the Patient Instructions below. Patient Instructions  Medication Instructions:  Your physician recommends that you continue on your current medications as directed. Please refer to the Current Medication list given to you today.   Labwork: None ordered  Testing/Procedures: None ordered  Follow-Up: Your physician recommends that you schedule a follow-up appointment in: 3 MONTHS WITH DR. Johnsie Cancel   Any Other Special Instructions Will Be Listed Below (If Applicable).     If you need a refill on your cardiac medications before your next appointment, please call your pharmacy.      Signed, Angelena Form, PA-C  06/01/2016 10:26 AM    [facility] [address], [place], [place]  [address] Phone: [phone]; Fax: [phone]

## 2016-06-01 ENCOUNTER — Telehealth: Payer: Self-pay | Admitting: *Deleted

## 2016-06-01 ENCOUNTER — Ambulatory Visit (INDEPENDENT_AMBULATORY_CARE_PROVIDER_SITE_OTHER): Payer: Medicare Other | Admitting: Physician Assistant

## 2016-06-01 ENCOUNTER — Encounter: Payer: Self-pay | Admitting: Physician Assistant

## 2016-06-01 VITALS — BP 136/62 | HR 72 | Ht 61.5 in | Wt 160.1 lb

## 2016-06-01 DIAGNOSIS — I2510 Atherosclerotic heart disease of native coronary artery without angina pectoris: Secondary | ICD-10-CM | POA: Diagnosis not present

## 2016-06-01 DIAGNOSIS — I10 Essential (primary) hypertension: Secondary | ICD-10-CM

## 2016-06-01 DIAGNOSIS — I350 Nonrheumatic aortic (valve) stenosis: Secondary | ICD-10-CM

## 2016-06-01 DIAGNOSIS — E785 Hyperlipidemia, unspecified: Secondary | ICD-10-CM | POA: Diagnosis not present

## 2016-06-01 DIAGNOSIS — Z09 Encounter for follow-up examination after completed treatment for conditions other than malignant neoplasm: Secondary | ICD-10-CM

## 2016-06-01 MED ORDER — CARVEDILOL 3.125 MG PO TABS: 3.1250 mg | ORAL_TABLET | Freq: Two times a day (BID) | ORAL | 3 refills | Status: DC

## 2016-06-01 NOTE — Patient Instructions (Signed)
Medication Instructions:  Your physician recommends that you continue on your current medications as directed. Please refer to the Current Medication list given to you today.   Labwork: None ordered  Testing/Procedures: None ordered  Follow-Up: Your physician recommends that you schedule a follow-up appointment in: 3 MONTHS WITH DR. NISHAN   Any Other Special Instructions Will Be Listed Below (If Applicable).   If you need a refill on your cardiac medications before your next appointment, please call your pharmacy.   

## 2016-06-01 NOTE — Telephone Encounter (Signed)
Called pt to advise her that she is to start a new med, coreg 3.125 bid.  Pt agreeable with this plan. Rx [facility] into [facility] [address].

## 2016-06-01 NOTE — Telephone Encounter (Signed)
lmptcb re: new medication, Coreg

## 2016-06-30 ENCOUNTER — Other Ambulatory Visit: Payer: Self-pay | Admitting: *Deleted

## 2016-06-30 MED ORDER — OLMESARTAN MEDOXOMIL 40 MG PO TABS: 40.0000 mg | ORAL_TABLET | Freq: Every day | ORAL | 3 refills | Status: DC

## 2016-06-30 NOTE — Telephone Encounter (Signed)
Paper refill request from [facility] for 90-day supply. Rx for olmesartan refilled.

## 2016-07-07 ENCOUNTER — Other Ambulatory Visit: Payer: Self-pay | Admitting: Cardiovascular Disease

## 2016-07-07 MED ORDER — ATORVASTATIN CALCIUM 80 MG PO TABS: 80.0000 mg | ORAL_TABLET | Freq: Every day | ORAL | 3 refills | Status: DC

## 2016-07-07 MED ORDER — AMLODIPINE BESYLATE 10 MG PO TABS: 10.0000 mg | ORAL_TABLET | Freq: Every day | ORAL | 3 refills | Status: DC

## 2016-07-30 ENCOUNTER — Telehealth (HOSPITAL_COMMUNITY): Payer: Self-pay | Admitting: *Deleted

## 2016-07-30 NOTE — Telephone Encounter (Signed)
Received signed Medicaid order for cardiac rehab.  Pt is eligible for reimbursement and meets the criteria.  Pt called for sign up.  Pt declined due to already walking and transportation issues. Pt thanked for her time. Cherre Huger, BSN

## 2016-10-03 NOTE — Progress Notes (Signed)
Cardiology Office Note    Date:  10/07/2016   ID:  Ammy, Mcelveen 1946-08-21, MRN [id number]  PCP:  Marton Redwood, MD  Cardiologist:  Judie Bonus seen 04/2016   CC: post hospital follow up.   History of Present Illness:  [name] is a 71 y.o. female with a history of tobacco abuse (recently quit), HTN, HLD and recently diagnosed CAD with NSTEMI s/p DES to RCA  who presents to clinic for post hospital follow up.   She was  admitted to [facility] from 9/15-9/19/17. She presented to [facility] on 05/14/16 with CP and dyspnea. POC troponin was abnormal at 0.37. She was admitted for further work up and management. Cardiac enzymes were cycled and were abnormal x 3, peaking at 0.46. She was placed on IV heparin. FLP was also obtained which demontrated an LDL of 126 mg/dL. She was placed on high intensity statin therapy with Lipitor. On 05/17/16, she underwent a LHC performed by Dr. Tamala Julian. She was found to have high-grade obstruction in the mid and distal RCA with both greater than 90-95% lesions reduced to 0% after drug-eluting stent implantation. Also residual significant proximal to mid circumflex disease with up to 80% stenosis within a very tortuous bifurcation segment and high-grade stenosis in the first diagonal. Widely patent LAD. Normal LVEF. Decision was made to treat residual CAD medically. If any recurrent angina, despite medical therapy, can consider PCI of the circumflex and diagonal. Circumflex would be at higher risk due to tortuosity/angulation. She was placed on mono antiplatelet therapy with Brilinta (due to an ASA Allergy), + amlodipine. She was monitored overnight and had no recurrent CP and no post cath complications.   Some exertional dyspnea  She has quit smoking after 40 years (pack used lasted her 1 month). She has a lot of anxiety and depression and thinks she needs something for this. No LE edema, orthopnea       Past Medical History:  Diagnosis Date  . Allergy   .  Arthritis   . H/O echocardiogram 2008   EF 65%, no WMA  . High cholesterol   . Hypertension     Past Surgical History:  Procedure Laterality Date  . CARDIAC CATHETERIZATION N/A 05/17/2016   Procedure: Left Heart Cath and Coronary Angiography;  Surgeon: Belva Crome, MD;  Location: [facility];  Service: Cardiovascular;  Laterality: N/A;  . CARDIAC CATHETERIZATION N/A 05/17/2016   Procedure: Coronary Stent Intervention;  Surgeon: Belva Crome, MD;  Location: [facility];  Service: Cardiovascular;  Laterality: N/A;   mid and distal RCA  . CESAREAN SECTION    . COLONOSCOPY    . MOUTH SURGERY     pt not sure what was removed under her toungue  . NECK SURGERY      Current Medications: Outpatient Medications Prior to Visit  Medication Sig Dispense Refill  . amLODipine (NORVASC) 10 MG tablet Take 1 tablet (10 mg total) by mouth daily. 90 tablet 3  . atorvastatin (LIPITOR) 80 MG tablet Take 1 tablet (80 mg total) by mouth at bedtime. 90 tablet 3  . carvedilol (COREG) 3.125 MG tablet Take 1 tablet (3.125 mg total) by mouth 2 (two) times daily. 180 tablet 3  . fluticasone (FLONASE) 50 MCG/ACT nasal spray Place 1 spray into both nostrils daily as needed for allergies or rhinitis.    [place] Kitchen nitroGLYCERIN (NITROSTAT) 0.4 MG SL tablet Place 1 tablet (0.4 mg total) under the tongue every 5 (  five) minutes x 3 doses as needed for chest pain. 25 tablet 2  . olmesartan (BENICAR) 40 MG tablet Take 1 tablet (40 mg total) by mouth daily. 90 tablet 3  . ticagrelor (BRILINTA) 90 MG TABS tablet Take 1 tablet (90 mg total) by mouth 2 (two) times daily. 180 tablet 3   No facility-administered medications prior to visit.      Allergies:   Aspirin   Social History   Social History  . Marital status: Single    Spouse name: N/A  . Number of children: N/A  . Years of education: N/A   Occupational History  . Retired from [employer] work    Social History Main Topics  . Smoking status: Current Some  Day Smoker    Packs/day: 0.25    Types: Cigarettes, Cigars  . Smokeless tobacco: Never Used     Comment: Suddenly could not stand the smell. Smokes about 1 cigar  every 3-4 days.  . Alcohol use No  . Drug use: No  . Sexual activity: Not Asked   Other Topics Concern  . None   Social History Narrative   Pt lives alone in [place], sister and a son live nearby.     Family History:  The patient's family history includes Stomach cancer in her sister.     ROS:   Please see the history of present illness.    ROS All other systems reviewed and are negative.   PHYSICAL EXAM:   VS:  BP 138/82   Pulse 70   Ht 5' 1.5" (1.562 m)   Wt 170 lb (77.1 kg)   SpO2 98%   BMI 31.60 kg/m    GEN: Well nourished, well developed, in no acute distress  HEENT: normal  Neck: no JVD, carotid bruits, or masses Cardiac: RRR; AS  murmurs, rubs, or gallops,no edema  Respiratory:  clear to auscultation bilaterally, normal work of breathing GI: soft, nontender, nondistended, + BS MS: no deformity or atrophy  Skin: warm and dry, no rash Neuro:  Alert and Oriented x 3, Strength and sensation are intact Psych: euthymic mood, full affect  Wt Readings from Last 3 Encounters:  10/07/16 170 lb (77.1 kg)  06/01/16 160 lb 1.9 oz (72.6 kg)  05/18/16 163 lb 2.3 oz (74 kg)      Studies/Labs Reviewed:   EKG:  06/01/16   NSR HR 73. Non specific ST flattening in diffuse leads  Recent Labs: 05/15/2016: ALT 16 05/18/2016: BUN 7; Creatinine, Ser 0.80; Hemoglobin 12.1; Platelets 221; Potassium 4.3; Sodium 137   Lipid Panel    Component Value Date/Time   CHOL 177 05/15/2016 0330   TRIG 98 05/15/2016 0330   HDL 31 (L) 05/15/2016 0330   CHOLHDL 5.7 05/15/2016 0330   VLDL 20 05/15/2016 0330   LDLCALC 126 (H) 05/15/2016 0330    Additional studies/ records that were reviewed today include:  05/17/16 Coronary Stent Intervention  Left Heart Cath and Coronary Angiography  Conclusion    The left  ventricular ejection fraction is 55-65% by visual estimate.  LV end diastolic pressure is normal.  The left ventricular systolic function is normal.  Prox Cx to Mid Cx lesion, 80 %stenosed.  1st Diag lesion, 85 %stenosed.  A STENT PROMUS PREM MR 2.5X20 drug eluting stent was successfully placed.  Prox RCA to Mid RCA lesion, 90 %stenosed.  Post intervention, there is a 0% residual stenosis.  A STENT PROMUS PREM MR 2.25X20 stent was successfully placed.  Dist RCA lesion,  95 %stenosed.  Post intervention, there is a 0% residual stenosis.   Non-ST elevation myocardial infarction due to high-grade obstruction in the mid and distal RCA with both greater than 90-95% lesions reduced to 0% after drug-eluting stent implantation. TIMI grade 3 flow was noted. The mid stent postdilated to 2.75 mm in diameter. The distal most lesion and was deployed a 2.25 mm in diameter.  Significant proximal to mid circumflex disease with up to 80% stenosis within a very tortuous bifurcation segment.  High-grade stenosis in the first diagonal.  Widely patent LAD.  Normal LV function.    2D Echo 05/15/16 Study Conclusions - Left ventricle: The cavity size was normal. There was moderate concentric hypertrophy. Systolic function was normal. The estimated ejection fraction was in the range of 60% to 65%. Wall motion was normal; there were no regional wall motion abnormalities. Doppler parameters are consistent with abnormal left ventricular relaxation (grade 1 diastolic dysfunction). There was no evidence of elevated ventricular filling pressure by Doppler parameters. - Aortic valve: Trileaflet; mildly thickened, moderately calcified leaflets. There was mild to moderate stenosis. There was no regurgitation. Mean gradient (S): 8 mm Hg. Peak gradient (S): 18 mm Hg. Valve area (VTI): 1.73 cm^2. Valve area (Vmax): 1.57 cm^2. Valve area (Vmean): 1.65 cm^2. - Right ventricle: The  cavity size was normal. Wall thickness was normal. Systolic function was normal. - Right atrium: The atrium was normal in size. - Tricuspid valve: There was trivial regurgitation. - Pulmonic valve: There was no regurgitation. - Pulmonary arteries: Systolic pressure was within the normal range. - Inferior vena cava: The vessel was normal in size. - Pericardium, extracardiac: There was no pericardial effusion.    ASSESSMENT & PLAN:   NSTEMI: troponin peaked at 0.46. [place] 9/18 showed high-grade obstruction in the mid and distal RCA with both greater than 90-95% lesions reduced to 0% after drug-eluting stent implantation. Also residual significant proximal to mid circumflex disease with up to 80% stenosis within a very tortuous bifurcation segment and high-grade stenosis in the first diagonal. Widely patent LAD. Normal LVEF. Will treat residual CAD medically. If any recurrent angina, despite medical therapy, can consider PCI of the circumflex and diagonal. Circumflex would be at higher risk due to tortuosity/angulation.  -- Continue single antiplatelet therapy with Brilinta (ASA allergy), high intensity statin, and ARB.  Coreg added last visit   Hyperlipidemia:  LDL 126. Continue with atorvastatin high intensity dose 80 mg  Essential hypertension:  Currently well controlled. Thinks one of her meds causing dizziness Not postural and BP ok today Cut benicar in half and see if it helps Symptoms preceded starting Zoloft   Tobacco use: counseled on importance of continued cessation  Aortic stenosis: mild to moderate on echocardiogram. F/u echo 04/2017  Asthma:  Wheezy today on Bre[place] called in albuterol inhaler f/u primary   Depression:  On Zoloft now f/u primary mood improved    Jenkins Rouge

## 2016-10-07 ENCOUNTER — Encounter: Payer: Self-pay | Admitting: Cardiovascular Disease

## 2016-10-07 ENCOUNTER — Ambulatory Visit (INDEPENDENT_AMBULATORY_CARE_PROVIDER_SITE_OTHER): Payer: Medicare Other | Admitting: Cardiovascular Disease

## 2016-10-07 VITALS — BP 138/82 | HR 70 | Ht 61.5 in | Wt 170.0 lb

## 2016-10-07 DIAGNOSIS — I2510 Atherosclerotic heart disease of native coronary artery without angina pectoris: Secondary | ICD-10-CM

## 2016-10-07 MED ORDER — ALBUTEROL SULFATE HFA 108 (90 BASE) MCG/ACT IN AERS: 2.0000 | INHALATION_SPRAY | Freq: Four times a day (QID) | RESPIRATORY_TRACT | 2 refills | Status: AC | PRN

## 2016-10-07 NOTE — Patient Instructions (Addendum)
Medication Instructions:  Your physician has recommended you make the following change in your medication:  1-START Albuterol inhaler take 1 to 2 puffs as needed for shortness of breath or wheezing.  Labwork: NONE  Testing/Procedures: NONE  Follow-Up: Your physician wants you to follow-up next available with Dr. Johnsie Cancel.   If you need a refill on your cardiac medications before your next appointment, please call your pharmacy.

## 2016-11-10 ENCOUNTER — Encounter: Payer: Self-pay | Admitting: Cardiovascular Disease

## 2016-11-16 NOTE — Progress Notes (Signed)
Cardiology Office Note    Date:  11/19/2016   ID:  [name], [name] 02-Oct-1945, MRN [id number]  PCP:  Marton Redwood, MD  Cardiologist:  Judie Bonus seen 04/2016   CC: post hospital follow up.   History of Present Illness:  [name] is a 71 y.o. female with a history of tobacco abuse  HTN, HLD and CAD with NSTEMI s/p DES to RCA  who presents to clinic for post hospital follow up.   She was  admitted to [facility] from 9/15-9/19/17. She presented to [facility] on 05/14/16 with CP and dyspnea. POC troponin was abnormal at 0.37.  On 05/17/16, she underwent a LHC performed by Dr. Tamala Julian. She was found to have high-grade obstruction in the mid and distal RCA with both greater than 90-95% lesions reduced to 0% after drug-eluting stent implantation. Also residual significant proximal to mid circumflex disease with up to 80% stenosis within a very tortuous bifurcation segment and high-grade stenosis in the first diagonal. Widely patent LAD. Normal LVEF. Decision was made to treat residual CAD medically. If any recurrent angina, despite medical therapy, can consider PCI of the circumflex and diagonal. Circumflex would be at higher risk due to tortuosity/angulation. She was placed on mono antiplatelet therapy with Brilinta (due to an ASA Allergy)  Some exertional dyspnea  She has quit smoking after 40 years (pack used lasted her 1 month). She has a lot of anxiety and depression and thinks she needs something for this. No LE edema, orthopnea  Last visit ARB cut Back due to dizziness and better  She has mild tightness in chest lasts seconds. Has not taken any nitro and tightness goes away quickly with rest Activity more limited by right knee arthritis. Tylenol not helping much uses a cane     Past Medical History:  Diagnosis Date  . Allergy   . Arthritis   . H/O echocardiogram 2008   EF 65%, no WMA  . High cholesterol   . Hypertension     Past Surgical History:  Procedure Laterality Date  .  CARDIAC CATHETERIZATION N/A 05/17/2016   Procedure: Left Heart Cath and Coronary Angiography;  Surgeon: Belva Crome, MD;  Location: [place] [facility];  Service: Cardiovascular;  Laterality: N/A;  . CARDIAC CATHETERIZATION N/A 05/17/2016   Procedure: Coronary Stent Intervention;  Surgeon: Belva Crome, MD;  Location: [place] [facility];  Service: Cardiovascular;  Laterality: N/A;   mid and distal RCA  . CESAREAN SECTION    . COLONOSCOPY    . MOUTH SURGERY     pt not sure what was removed under her toungue  . NECK SURGERY      Current Medications: Outpatient Medications Prior to Visit  Medication Sig Dispense Refill  . albuterol (PROVENTIL HFA;VENTOLIN HFA) 108 (90 Base) MCG/ACT inhaler Inhale 2 puffs into the lungs every 6 (six) hours as needed for wheezing or shortness of breath. 1 Inhaler 2  . amLODipine (NORVASC) 10 MG tablet Take 1 tablet (10 mg total) by mouth daily. 90 tablet 3  . atorvastatin (LIPITOR) 80 MG tablet Take 1 tablet (80 mg total) by mouth at bedtime. 90 tablet 3  . fluticasone (FLONASE) 50 MCG/ACT nasal spray Place 1 spray into both nostrils daily as needed for allergies or rhinitis.    . Fluticasone Furoate-Vilanterol (BREO ELLIPTA IN) Inhale into the lungs daily. Use as directed    . meclizine (ANTIVERT) 25 MG tablet Take 1 tablet by mouth daily as needed.  Take as directed    . nitroGLYCERIN (NITROSTAT) 0.4 MG SL tablet Place 1 tablet (0.4 mg total) under the tongue every 5 (five) minutes x 3 doses as needed for chest pain. 25 tablet 2  . olmesartan (BENICAR) 40 MG tablet Take 1 tablet (40 mg total) by mouth daily. 90 tablet 3  . pantoprazole (PROTONIX) 40 MG tablet Take 1 tablet by mouth daily.    . sertraline (ZOLOFT) 50 MG tablet Take 1 tablet by mouth daily.    . ticagrelor (BRILINTA) 90 MG TABS tablet Take 1 tablet (90 mg total) by mouth 2 (two) times daily. 180 tablet 3  . carvedilol (COREG) 3.125 MG tablet Take 1 tablet (3.125 mg total) by mouth 2 (two) times  daily. 180 tablet 3   No facility-administered medications prior to visit.      Allergies:   Aspirin   Social History   Social History  . Marital status: Single    Spouse name: N/A  . Number of children: N/A  . Years of education: N/A   Occupational History  . Retired from [employer] work    Social History Main Topics  . Smoking status: Current Some Day Smoker    Packs/day: 0.25    Types: Cigarettes, Cigars  . Smokeless tobacco: Never Used     Comment: Suddenly could not stand the smell. Smokes about 1 cigar  every 3-4 days.  . Alcohol use No  . Drug use: No  . Sexual activity: Not Asked   Other Topics Concern  . None   Social History Narrative   Pt lives alone in [place], sister and a son live nearby.     Family History:  The patient's family history includes Stomach cancer in her sister.     ROS:   Please see the history of present illness.    ROS All other systems reviewed and are negative.   PHYSICAL EXAM:   VS:  BP 120/60   Pulse 72   Ht 5' 1.5" (1.562 m)   Wt 178 lb 1.9 oz (80.8 kg)   SpO2 95%   BMI 33.11 kg/m    Affect appropriate Elderly black female  HEENT: normal Neck supple with no adenopathy JVP normal no bruits no thyromegaly Lungs clear with no wheezing and good diaphragmatic motion Heart:  S1/S2 midl AS  murmur, no rub, gallop or click PMI normal Abdomen: benighn, BS positve, no tenderness, no AAA no bruit.  No HSM or HJR Distal pulses intact with no bruits No edema Neuro non-focal Skin warm and dry Mild swelling right knee from arthritis    Wt Readings from Last 3 Encounters:  11/19/16 178 lb 1.9 oz (80.8 kg)  10/07/16 170 lb (77.1 kg)  06/01/16 160 lb 1.9 oz (72.6 kg)      Studies/Labs Reviewed:   EKG:  06/01/16   NSR HR 73. Non specific ST flattening in diffuse leads  Recent Labs: 05/15/2016: ALT 16 05/18/2016: BUN 7; Creatinine, Ser 0.80; Hemoglobin 12.1; Platelets 221; Potassium 4.3; Sodium 137   Lipid Panel      Component Value Date/Time   CHOL 177 05/15/2016 0330   TRIG 98 05/15/2016 0330   HDL 31 (L) 05/15/2016 0330   CHOLHDL 5.7 05/15/2016 0330   VLDL 20 05/15/2016 0330   LDLCALC 126 (H) 05/15/2016 0330    Additional studies/ records that were reviewed today include:  05/17/16 Coronary Stent Intervention  Left Heart Cath and Coronary Angiography  Conclusion    The left ventricular  ejection fraction is 55-65% by visual estimate.  LV end diastolic pressure is normal.  The left ventricular systolic function is normal.  Prox Cx to Mid Cx lesion, 80 %stenosed.  1st Diag lesion, 85 %stenosed.  A STENT PROMUS PREM MR 2.5X20 drug eluting stent was successfully placed.  Prox RCA to Mid RCA lesion, 90 %stenosed.  Post intervention, there is a 0% residual stenosis.  A STENT PROMUS PREM MR 2.25X20 stent was successfully placed.  Dist RCA lesion, 95 %stenosed.  Post intervention, there is a 0% residual stenosis.   Non-ST elevation myocardial infarction due to high-grade obstruction in the mid and distal RCA with both greater than 90-95% lesions reduced to 0% after drug-eluting stent implantation. TIMI grade 3 flow was noted. The mid stent postdilated to 2.75 mm in diameter. The distal most lesion and was deployed a 2.25 mm in diameter.  Significant proximal to mid circumflex disease with up to 80% stenosis within a very tortuous bifurcation segment.  High-grade stenosis in the first diagonal.  Widely patent LAD.  Normal LV function.    2D Echo 05/15/16 Study Conclusions - Left ventricle: The cavity size was normal. There was moderate concentric hypertrophy. Systolic function was normal. The estimated ejection fraction was in the range of 60% to 65%. Wall motion was normal; there were no regional wall motion abnormalities. Doppler parameters are consistent with abnormal left ventricular relaxation (grade 1 diastolic dysfunction). There was no evidence of  elevated ventricular filling pressure by Doppler parameters. - Aortic valve: Trileaflet; mildly thickened, moderately calcified leaflets. There was mild to moderate stenosis. There was no regurgitation. Mean gradient (S): 8 mm Hg. Peak gradient (S): 18 mm Hg. Valve area (VTI): 1.73 cm^2. Valve area (Vmax): 1.57 cm^2. Valve area (Vmean): 1.65 cm^2. - Right ventricle: The cavity size was normal. Wall thickness was normal. Systolic function was normal. - Right atrium: The atrium was normal in size. - Tricuspid valve: There was trivial regurgitation. - Pulmonic valve: There was no regurgitation. - Pulmonary arteries: Systolic pressure was within the normal range. - Inferior vena cava: The vessel was normal in size. - Pericardium, extracardiac: There was no pericardial effusion.    ASSESSMENT & PLAN:   CAD:  Stent to RCA  05/17/16  Residual complex mid circumflex stenosis and D1 stenosis . Continue  Medical Rx If symptoms progress or she starts needing nitro frequently can consider high risk intervention   Hyperlipidemia:  LDL 126. Continue with atorvastatin high intensity dose 80 mg  Essential hypertension:  Currently well controlled. Thinks one of her meds causing dizziness Not postural and BP ok today Cut benicar in half and see if it helps Symptoms preceded starting Zoloft   Tobacco use: counseled on importance of continued cessation  Aortic stenosis: mild to moderate on echocardiogram. F/u echo 04/2017  Asthma:   Continue albuterol and Breo    Depression:  On Zoloft now f/u primary mood improved   Arthritis:  f/u Dr Brigitte Pulse consider meloxicam for right knee pain    Jenkins Rouge

## 2016-11-19 ENCOUNTER — Ambulatory Visit (INDEPENDENT_AMBULATORY_CARE_PROVIDER_SITE_OTHER): Payer: Medicare Other | Admitting: Cardiovascular Disease

## 2016-11-19 ENCOUNTER — Encounter (INDEPENDENT_AMBULATORY_CARE_PROVIDER_SITE_OTHER): Payer: Self-pay

## 2016-11-19 ENCOUNTER — Encounter: Payer: Self-pay | Admitting: Cardiovascular Disease

## 2016-11-19 VITALS — BP 120/60 | HR 72 | Ht 61.5 in | Wt 178.1 lb

## 2016-11-19 DIAGNOSIS — I2510 Atherosclerotic heart disease of native coronary artery without angina pectoris: Secondary | ICD-10-CM

## 2016-11-19 MED ORDER — CARVEDILOL 25 MG PO TABS: 25.0000 mg | ORAL_TABLET | Freq: Two times a day (BID) | ORAL | 3 refills | Status: DC

## 2016-11-19 NOTE — Patient Instructions (Signed)

## 2016-12-03 ENCOUNTER — Other Ambulatory Visit: Payer: Self-pay | Admitting: Cardiology

## 2017-03-11 ENCOUNTER — Other Ambulatory Visit: Payer: Self-pay | Admitting: Internal Medicine

## 2017-03-11 DIAGNOSIS — Z1231 Encounter for screening mammogram for malignant neoplasm of breast: Secondary | ICD-10-CM

## 2017-03-12 ENCOUNTER — Other Ambulatory Visit: Payer: Self-pay | Admitting: Cardiology

## 2017-04-06 ENCOUNTER — Ambulatory Visit: Payer: Medicare Other

## 2017-05-01 ENCOUNTER — Other Ambulatory Visit: Payer: Self-pay | Admitting: Cardiology

## 2017-06-24 ENCOUNTER — Other Ambulatory Visit: Payer: Self-pay | Admitting: Cardiology

## 2017-07-05 NOTE — Progress Notes (Deleted)
Cardiology Office Note    Date:  07/05/2017   ID:  Elizabeth, [name] Jul 20, 1946, MRN [id number]  PCP:  Marton Redwood, MD  Cardiologist:  Judie Bonus seen 04/2016   CC: post hospital follow up.   History of Present Illness:  [name] is a 71 y.o. female with a history of tobacco abuse  HTN, HLD and CAD with NSTEMI s/p DES to RCA  who presents to clinic for f/u   She was  admitted to [facility] from 9/15-9/19/17. She presented to [facility] on 05/14/16 with CP and dyspnea. POC troponin was abnormal at 0.37.  On 05/17/16, she underwent a LHC performed by Dr. Tamala Julian. She was found to have high-grade obstruction in the mid and distal RCA with both greater than 90-95% lesions reduced to 0% after drug-eluting stent implantation. Also residual significant proximal to mid circumflex disease with up to 80% stenosis within a very tortuous bifurcation segment and high-grade stenosis in the first diagonal. Widely patent LAD. Normal LVEF. Decision was made to treat residual CAD medically. If any recurrent angina, despite medical therapy, can consider PCI of the circumflex and diagonal. Circumflex would be at higher risk due to tortuosity/angulation. She was placed on mono antiplatelet therapy with Brilinta (due to an ASA Allergy)  Quit smoking after 40 years *** She has a lot of anxiety and depression and thinks she needs something for this. Dizziness with higher dose ARB   *** Activity more limited by right knee arthritis.      Past Medical History:  Diagnosis Date  . Allergy   . Arthritis   . H/O echocardiogram 2008   EF 65%, no WMA  . High cholesterol   . Hypertension     Past Surgical History:  Procedure Laterality Date  . CESAREAN SECTION    . COLONOSCOPY    . MOUTH SURGERY     pt not sure what was removed under her toungue  . NECK SURGERY      Current Medications: Outpatient Medications Prior to Visit  Medication Sig Dispense Refill  . albuterol (PROVENTIL HFA;VENTOLIN HFA) 108  (90 Base) MCG/ACT inhaler Inhale 2 puffs into the lungs every 6 (six) hours as needed for wheezing or shortness of breath. 1 Inhaler 2  . amLODipine (NORVASC) 10 MG tablet Take 1 tablet (10 mg total) by mouth daily. Please call and schedule a six month follow up appointment per last office visit 30 tablet 5  . atorvastatin (LIPITOR) 80 MG tablet TAKE 1 TABLET (80 MG TOTAL) BY MOUTH AT BEDTIME. 30 tablet 4  . BRILINTA 90 MG TABS tablet TAKE 1 TABLET (90 MG TOTAL) BY MOUTH 2 (TWO) TIMES DAILY. 180 tablet 1  . carvedilol (COREG) 25 MG tablet Take 1 tablet (25 mg total) by mouth 2 (two) times daily with a meal. 180 tablet 3  . fluticasone (FLONASE) 50 MCG/ACT nasal spray Place 1 spray into both nostrils daily as needed for allergies or rhinitis.    . Fluticasone Furoate-Vilanterol (BREO ELLIPTA IN) Inhale into the lungs daily. Use as directed    . meclizine (ANTIVERT) 25 MG tablet Take 1 tablet by mouth daily as needed. Take as directed    . nitroGLYCERIN (NITROSTAT) 0.4 MG SL tablet Place 1 tablet (0.4 mg total) under the tongue every 5 (five) minutes x 3 doses as needed for chest pain. 25 tablet 2  . olmesartan (BENICAR) 40 MG tablet Take 1 tablet (40 mg total) by mouth daily. 90 tablet 3  .  pantoprazole (PROTONIX) 40 MG tablet Take 1 tablet by mouth daily.    . sertraline (ZOLOFT) 50 MG tablet Take 1 tablet by mouth daily.     No facility-administered medications prior to visit.      Allergies:   Aspirin   Social History   Socioeconomic History  . Marital status: Single    Spouse name: Not on file  . Number of children: Not on file  . Years of education: Not on file  . Highest education level: Not on file  Social Needs  . Financial resource strain: Not on file  . Food insecurity - worry: Not on file  . Food insecurity - inability: Not on file  . Transportation needs - medical: Not on file  . Transportation needs - non-medical: Not on file  Occupational History  . Occupation: Retired  from [employer] work  Tobacco Use  . Smoking status: Current Some Day Smoker    Packs/day: 0.25    Types: Cigarettes, Cigars  . Smokeless tobacco: Never Used  . Tobacco comment: Suddenly could not stand the smell. Smokes about 1 cigar  every 3-4 days.  Substance and Sexual Activity  . Alcohol use: No    Alcohol/week: 0.0 oz  . Drug use: No  . Sexual activity: Not on file  Other Topics Concern  . Not on file  Social History Narrative   Pt lives alone in [place], sister and a son live nearby.     Family History:  The patient's family history includes Stomach cancer in her sister.     ROS:   Please see the history of present illness.    ROS All other systems reviewed and are negative.   PHYSICAL EXAM:   VS:  There were no vitals taken for this visit.   Affect appropriate Healthy:  appears stated age 70: normal Neck supple with no adenopathy JVP normal no bruits no thyromegaly Lungs clear with no wheezing and good diaphragmatic motion Heart:  S1/S2 preserved AS  murmur, no rub, gallop or click PMI normal Abdomen: benighn, BS positve, no tenderness, no AAA no bruit.  No HSM or HJR Distal pulses intact with no bruits No edema Neuro non-focal Skin warm and dry No muscular weakness    Wt Readings from Last 3 Encounters:  11/19/16 178 lb 1.9 oz (80.8 kg)  10/07/16 170 lb (77.1 kg)  06/01/16 160 lb 1.9 oz (72.6 kg)      Studies/Labs Reviewed:   EKG:  06/01/16   NSR HR 73. Non specific ST flattening in diffuse leads  Recent Labs: No results found for requested labs within last 8760 hours.   Lipid Panel    Component Value Date/Time   CHOL 177 05/15/2016 0330   TRIG 98 05/15/2016 0330   HDL 31 (L) 05/15/2016 0330   CHOLHDL 5.7 05/15/2016 0330   VLDL 20 05/15/2016 0330   LDLCALC 126 (H) 05/15/2016 0330    Additional studies/ records that were reviewed today include:  05/17/16 Coronary Stent Intervention  Left Heart Cath and Coronary Angiography  Conclusion      The left ventricular ejection fraction is 55-65% by visual estimate.  LV end diastolic pressure is normal.  The left ventricular systolic function is normal.  Prox Cx to Mid Cx lesion, 80 %stenosed.  1st Diag lesion, 85 %stenosed.  A STENT PROMUS PREM MR 2.5X20 drug eluting stent was successfully placed.  Prox RCA to Mid RCA lesion, 90 %stenosed.  Post intervention, there is a 0% residual stenosis.  A STENT PROMUS PREM MR 2.25X20 stent was successfully placed.  Dist RCA lesion, 95 %stenosed.  Post intervention, there is a 0% residual stenosis.   Non-ST elevation myocardial infarction due to high-grade obstruction in the mid and distal RCA with both greater than 90-95% lesions reduced to 0% after drug-eluting stent implantation. TIMI grade 3 flow was noted. The mid stent postdilated to 2.75 mm in diameter. The distal most lesion and was deployed a 2.25 mm in diameter.  Significant proximal to mid circumflex disease with up to 80% stenosis within a very tortuous bifurcation segment.  High-grade stenosis in the first diagonal.  Widely patent LAD.  Normal LV function.    2D Echo 05/15/16 Study Conclusions - Left ventricle: The cavity size was normal. There was moderate concentric hypertrophy. Systolic function was normal. The estimated ejection fraction was in the range of 60% to 65%. Wall motion was normal; there were no regional wall motion abnormalities. Doppler parameters are consistent with abnormal left ventricular relaxation (grade 1 diastolic dysfunction). There was no evidence of elevated ventricular filling pressure by Doppler parameters. - Aortic valve: Trileaflet; mildly thickened, moderately calcified leaflets. There was mild to moderate stenosis. There was no regurgitation. Mean gradient (S): 8 mm Hg. Peak gradient (S): 18 mm Hg. Valve area (VTI): 1.73 cm^2. Valve area (Vmax): 1.57 cm^2. Valve area (Vmean): 1.65 cm^2. - Right  ventricle: The cavity size was normal. Wall thickness was normal. Systolic function was normal. - Right atrium: The atrium was normal in size. - Tricuspid valve: There was trivial regurgitation. - Pulmonic valve: There was no regurgitation. - Pulmonary arteries: Systolic pressure was within the normal range. - Inferior vena cava: The vessel was normal in size. - Pericardium, extracardiac: There was no pericardial effusion.    ASSESSMENT & PLAN:   CAD:  Stent to RCA  05/17/16  Residual complex mid circumflex stenosis and D1 stenosis . Continue  Medical Rx If symptoms progress or she starts needing nitro frequently can consider high risk intervention   Hyperlipidemia:  LDL 126. Continue with atorvastatin high intensity dose 80 mg  Essential hypertension:  ARB cut back due to dizziness ? From Zoloft   Tobacco use: counseled on importance of continued cessation  Aortic stenosis: mild to moderate on echocardiogram. F/u echo ordered mean gradient 8 mmHg peak 18 mmHg 04/2016   Asthma:   Continue albuterol and Breo    Depression:  On Zoloft now f/u primary mood improved   Arthritis:  f/u Dr Brigitte Pulse consider meloxicam for right knee pain    Jenkins Rouge

## 2017-07-07 ENCOUNTER — Ambulatory Visit: Payer: Medicare Other | Admitting: Cardiovascular Disease

## 2017-07-25 ENCOUNTER — Other Ambulatory Visit: Payer: Self-pay | Admitting: Cardiovascular Disease

## 2017-09-16 ENCOUNTER — Other Ambulatory Visit: Payer: Self-pay | Admitting: Cardiovascular Disease

## 2017-09-16 MED ORDER — ATORVASTATIN CALCIUM 80 MG PO TABS: 80.0000 mg | ORAL_TABLET | Freq: Every day | ORAL | 1 refills | Status: DC

## 2017-10-18 ENCOUNTER — Other Ambulatory Visit: Payer: Self-pay | Admitting: Cardiovascular Disease

## 2017-10-18 MED ORDER — OLMESARTAN MEDOXOMIL 40 MG PO TABS: 40.0000 mg | ORAL_TABLET | Freq: Every day | ORAL | 0 refills | Status: DC

## 2017-12-14 ENCOUNTER — Other Ambulatory Visit: Payer: Self-pay | Admitting: Cardiovascular Disease

## 2017-12-18 ENCOUNTER — Other Ambulatory Visit: Payer: Self-pay | Admitting: Cardiovascular Disease

## 2018-01-22 ENCOUNTER — Other Ambulatory Visit: Payer: Self-pay | Admitting: Cardiovascular Disease

## 2018-01-23 ENCOUNTER — Other Ambulatory Visit: Payer: Self-pay | Admitting: Cardiovascular Disease

## 2018-02-07 ENCOUNTER — Other Ambulatory Visit: Payer: Self-pay | Admitting: Cardiovascular Disease

## 2018-02-28 ENCOUNTER — Other Ambulatory Visit: Payer: Self-pay | Admitting: Cardiovascular Disease

## 2018-03-01 ENCOUNTER — Other Ambulatory Visit: Payer: Self-pay | Admitting: Cardiovascular Disease

## 2018-03-01 MED ORDER — CARVEDILOL 25 MG PO TABS: 25.0000 mg | ORAL_TABLET | Freq: Two times a day (BID) | ORAL | 0 refills | Status: AC

## 2018-03-01 MED ORDER — OLMESARTAN MEDOXOMIL 40 MG PO TABS: 40.0000 mg | ORAL_TABLET | Freq: Every day | ORAL | 0 refills | Status: AC

## 2018-03-06 ENCOUNTER — Other Ambulatory Visit: Payer: Self-pay | Admitting: Cardiovascular Disease

## 2018-03-22 ENCOUNTER — Other Ambulatory Visit: Payer: Self-pay | Admitting: Cardiovascular Disease

## 2018-04-08 ENCOUNTER — Other Ambulatory Visit: Payer: Self-pay | Admitting: Cardiovascular Disease

## 2019-11-08 ENCOUNTER — Encounter: Payer: Self-pay | Admitting: Cardiovascular Disease
